# Patient Record
Sex: Female | Born: 1995
Health system: Southern US, Community
[De-identification: ages and names within clinical notes are randomized; demographics above are authoritative.]

## PROBLEM LIST (undated history)

## (undated) DIAGNOSIS — F4310 Post-traumatic stress disorder, unspecified: Secondary | ICD-10-CM

## (undated) DIAGNOSIS — R55 Syncope and collapse: Secondary | ICD-10-CM

## (undated) DIAGNOSIS — N83209 Unspecified ovarian cyst, unspecified side: Secondary | ICD-10-CM

## (undated) HISTORY — PX: TONSILLECTOMY: SUR1361

## (undated) HISTORY — DX: Syncope and collapse: R55

## (undated) HISTORY — PX: OOPHORECTOMY: SHX86

---
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
## 2016-03-21 ENCOUNTER — Emergency Department (HOSPITAL_COMMUNITY)
Admission: EM | Admit: 2016-03-21 | Discharge: 2016-03-21 | Disposition: A | Discharge status: Home / Self Care | Payer: BLUE CROSS/BLUE SHIELD | Attending: Emergency Medicine | Admitting: Emergency Medicine

## 2016-03-21 ENCOUNTER — Encounter (HOSPITAL_COMMUNITY): Payer: Self-pay | Admitting: Emergency Medicine

## 2016-03-21 DIAGNOSIS — R112 Nausea with vomiting, unspecified: Secondary | ICD-10-CM

## 2016-03-21 DIAGNOSIS — R1084 Generalized abdominal pain: Secondary | ICD-10-CM

## 2016-03-21 HISTORY — DX: Unspecified ovarian cyst, unspecified side: N83.209

## 2016-03-21 HISTORY — DX: Post-traumatic stress disorder, unspecified: F43.10

## 2016-03-21 LAB — URINALYSIS, ROUTINE W REFLEX MICROSCOPIC
GLUCOSE, UA: NEGATIVE mg/dL
HGB URINE DIPSTICK: NEGATIVE
LEUKOCYTES UA: NEGATIVE
Nitrite: NEGATIVE
PROTEIN: NEGATIVE mg/dL
Specific Gravity, Urine: 1.039 — ABNORMAL HIGH (ref 1.005–1.030)
pH: 5.5 (ref 5.0–8.0)

## 2016-03-21 LAB — CBC
HCT: 41.2 % (ref 36.0–46.0)
Hemoglobin: 13.7 g/dL (ref 12.0–15.0)
MCH: 30.7 pg (ref 26.0–34.0)
MCHC: 33.3 g/dL (ref 30.0–36.0)
MCV: 92.4 fL (ref 78.0–100.0)
PLATELETS: 213 10*3/uL (ref 150–400)
RBC: 4.46 MIL/uL (ref 3.87–5.11)
RDW: 13 % (ref 11.5–15.5)
WBC: 5 10*3/uL (ref 4.0–10.5)

## 2016-03-21 LAB — COMPREHENSIVE METABOLIC PANEL
ALK PHOS: 48 U/L (ref 38–126)
ALT: 15 U/L (ref 14–54)
AST: 17 U/L (ref 15–41)
Albumin: 4.5 g/dL (ref 3.5–5.0)
Anion gap: 6 (ref 5–15)
BILIRUBIN TOTAL: 1.2 mg/dL (ref 0.3–1.2)
BUN: 15 mg/dL (ref 6–20)
CALCIUM: 8.7 mg/dL — AB (ref 8.9–10.3)
CHLORIDE: 107 mmol/L (ref 101–111)
CO2: 25 mmol/L (ref 22–32)
CREATININE: 0.75 mg/dL (ref 0.44–1.00)
GFR calc Af Amer: >60 mL/min (ref >60)
Glucose, Bld: 88 mg/dL (ref 65–99)
Potassium: 3.4 mmol/L — ABNORMAL LOW (ref 3.5–5.1)
Sodium: 138 mmol/L (ref 135–145)
TOTAL PROTEIN: 7.4 g/dL (ref 6.5–8.1)

## 2016-03-21 LAB — I-STAT BETA HCG BLOOD, ED (MC, WL, AP ONLY): I-stat hCG, quantitative: <5 m[IU]/mL (ref <5)

## 2016-03-21 LAB — LIPASE, BLOOD: Lipase: 18 U/L (ref 11–51)

## 2016-03-21 MED ORDER — FAMOTIDINE 20 MG PO TABS: 20.0000 mg | ORAL_TABLET | Freq: Two times a day (BID) | ORAL | 0 refills | Status: DC

## 2016-03-21 MED ORDER — ONDANSETRON HCL 4 MG/2ML IJ SOLN
4.0000 mg | Freq: Once | INTRAMUSCULAR | Status: AC
  Administered 2016-03-21: 4 mg via INTRAVENOUS
  Filled 2016-03-21: qty 2

## 2016-03-21 MED ORDER — ONDANSETRON 4 MG PO TBDP: 4.0000 mg | ORAL_TABLET | Freq: Three times a day (TID) | ORAL | 0 refills | Status: DC | PRN

## 2016-03-21 MED ORDER — DICYCLOMINE HCL 10 MG PO CAPS
10.0000 mg | ORAL_CAPSULE | Freq: Once | ORAL | Status: AC
  Administered 2016-03-21: 10 mg via ORAL
  Filled 2016-03-21: qty 1

## 2016-03-21 MED ORDER — KETOROLAC TROMETHAMINE 30 MG/ML IJ SOLN
15.0000 mg | Freq: Once | INTRAMUSCULAR | Status: AC
  Administered 2016-03-21: 15 mg via INTRAVENOUS
  Filled 2016-03-21: qty 1

## 2016-03-21 MED ORDER — SODIUM CHLORIDE 0.9 % IV BOLUS (SEPSIS)
1000.0000 mL | Freq: Once | INTRAVENOUS | Status: AC
  Administered 2016-03-21: 1000 mL via INTRAVENOUS

## 2016-03-21 MED ORDER — ONDANSETRON 4 MG PO TBDP
4.0000 mg | ORAL_TABLET | Freq: Once | ORAL | Status: AC | PRN
  Administered 2016-03-21: 4 mg via ORAL
  Filled 2016-03-21: qty 1

## 2016-03-21 MED ORDER — DICYCLOMINE HCL 10 MG PO CAPS
10.0000 mg | ORAL_CAPSULE | Freq: Once | ORAL | Status: DC
  Filled 2016-03-21: qty 1

## 2016-03-21 MED ORDER — DICYCLOMINE HCL 20 MG PO TABS: 20.0000 mg | ORAL_TABLET | Freq: Two times a day (BID) | ORAL | 0 refills | Status: DC

## 2016-03-21 NOTE — Discharge Instructions (Signed)
Please read and follow all provided instructions.  Your diagnoses today include:  1. Non-intractable vomiting with nausea, unspecified vomiting type   2. Generalized abdominal pain     Tests performed today include:  Blood counts and electrolytes  Blood tests to check liver and kidney function  Blood tests to check pancreas function  Blood test for pregnancy - negative  Urine test to look for infection - urine shows mild dehydration  Vital signs. See below for your results today.   Medications prescribed:   Bentyl - medication for intestinal cramps and spasms   Zofran (ondansetron) - for nausea and vomiting   Pepcid (famotidine) - antihistamine  You can find this medication over-the-counter.   DO NOT exceed:   20mg  Pepcid every 12 hours  Take any prescribed medications only as directed.  Home care instructions:   Follow any educational materials contained in this packet.  Follow-up instructions: Please follow-up with your primary care provider in the next 2 days for further evaluation of your symptoms.    Return instructions:  SEEK IMMEDIATE MEDICAL ATTENTION IF:  The pain does not go away or becomes severe   A temperature above 101F develops   Repeated vomiting occurs (multiple episodes)   The pain becomes localized to portions of the abdomen. The right side could possibly be appendicitis. In an adult, the left lower portion of the abdomen could be colitis or diverticulitis.   Blood is being passed in stools or vomit (bright red or black tarry stools)   You develop chest pain, difficulty breathing, dizziness or fainting, or become confused, poorly responsive, or inconsolable (young children)  If you have any other emergent concerns regarding your health  Additional Information: Abdominal (belly) pain can be caused by many things. Your caregiver performed an examination and possibly ordered blood/urine tests and imaging (CT scan, x-rays, ultrasound).  Many cases can be observed and treated at home after initial evaluation in the emergency department. Even though you are being discharged home, abdominal pain can be unpredictable. Therefore, you need a repeated exam if your pain does not resolve, returns, or worsens. Most patients with abdominal pain don't have to be admitted to the hospital or have surgery, but serious problems like appendicitis and gallbladder attacks can start out as nonspecific pain. Many abdominal conditions cannot be diagnosed in one visit, so follow-up evaluations are very important.  Your vital signs today were: BP 110/71 (BP Location: Right Arm)    Pulse 63    Temp 98.2 F (36.8 C) (Oral)    Resp 18    Ht 5\' 8"  (1.727 m)    Wt 78 kg    LMP 03/19/2016    SpO2 99%    BMI 26.15 kg/m  If your blood pressure (bp) was elevated above 135/85 this visit, please have this repeated by your doctor within one month. --------------

## 2016-03-21 NOTE — ED Triage Notes (Signed)
[facility], c/o generalized abdominal pain and emesis since last night. Denies fevers and diarrhea.

## 2016-03-21 NOTE — ED Notes (Signed)
Discharge instructions, follow up care, and rx x3 reviewed with patient. Patient verbalized understanding. 

## 2016-03-21 NOTE — ED Provider Notes (Signed)
[facility]-EMERGENCY DEPT Provider Note   CSN: [id number] Arrival date & time: 03/21/16  1110     History   Chief Complaint Chief Complaint  Patient presents with  . Abdominal Pain  . Emesis    HPI Kelly Morton is a 20 y.o. female.  [facility] complaint of abdominal pain starting yesterday. Pain is generalized. It has been associated with nausea and multiple episodes of vomiting. Vomiting is nonbloody, nonbilious. No diarrhea. No fevers, chest pain, shortness of breath, urinary symptoms, or skin rashes. Patient has a history of ovarian cyst surgery. No other abdominal surgeries. States that she has not been able to keep down solids or liquids. The onset of this condition was acute. The course is constant. Aggravating factors: none. Alleviating factors: none.        Past Medical History:  Diagnosis Date  . Ovarian cyst   . PTSD (post-traumatic stress disorder)     There are no active problems to display for this patient.   Past Surgical History:  Procedure Laterality Date  . OOPHORECTOMY    . TONSILLECTOMY      OB History    No data available       Home Medications    Prior to Admission medications   Not on File    Family History History reviewed. No pertinent family history.  Social History Social History  Substance Use Topics  . Smoking status: Never Smoker  . Smokeless tobacco: Never Used  . Alcohol use No     Allergies   Bactrim [sulfamethoxazole-trimethoprim] and Sulfur   Review of Systems Review of Systems  Constitutional: Negative for fever.  HENT: Negative for rhinorrhea and sore throat.   Eyes: Negative for redness.  Respiratory: Negative for cough.   Cardiovascular: Negative for chest pain.  Gastrointestinal: Positive for abdominal pain, nausea and vomiting. Negative for blood in stool and diarrhea.  Genitourinary: Negative for dysuria, frequency and hematuria.  Musculoskeletal: Negative for  myalgias.  Skin: Negative for rash.  Neurological: Negative for headaches.     Physical Exam Updated Vital Signs BP 138/88 (BP Location: Right Arm)   Pulse 84   Temp 98.2 F (36.8 C) (Oral)   Resp 18   Ht 5\' 8"  (1.727 m)   Wt 78 kg   LMP 03/19/2016   SpO2 98%   BMI 26.15 kg/m   Physical Exam  Constitutional: She appears well-developed and well-nourished.  HENT:  Head: Normocephalic and atraumatic.  Mouth/Throat: Oropharynx is clear and moist.  Eyes: Conjunctivae are normal. Right eye exhibits no discharge. Left eye exhibits no discharge.  Neck: Normal range of motion. Neck supple.  Cardiovascular: Normal rate, regular rhythm and normal heart sounds.  Exam reveals no friction rub.   No murmur heard. Pulmonary/Chest: Effort normal and breath sounds normal. No respiratory distress. She has no wheezes. She has no rales.  Abdominal: Soft. She exhibits no distension and no mass. There is tenderness (moderate, generalized). There is no guarding.  Neurological: She is alert.  Skin: Skin is warm and dry.  Psychiatric: She has a normal mood and affect.  Nursing note and vitals reviewed.    ED Treatments / Results  Labs (all labs ordered are listed, but only abnormal results are displayed) Labs Reviewed  COMPREHENSIVE METABOLIC PANEL - Abnormal; Notable for the following:       Result Value   Potassium 3.4 (*)    Calcium 8.7 (*)    All other components within normal limits  URINALYSIS, ROUTINE W REFLEX MICROSCOPIC (NOT [facility]) - Abnormal; Notable for the following:    Color, Urine AMBER (*)    Specific Gravity, Urine 1.039 (*)    Bilirubin Urine SMALL (*)    Ketones, ur >80 (*)    All other components within normal limits  LIPASE, BLOOD  CBC  I-STAT BETA HCG BLOOD, ED ([facility], [facility], [facility] ONLY)    Procedures Procedures (including critical care time)  Medications Ordered in ED Medications  ondansetron (ZOFRAN-ODT) disintegrating tablet 4 mg (4 mg Oral Given 03/21/16 1126)    sodium chloride 0.9 % bolus 1,000 mL (0 mLs Intravenous Stopped 03/21/16 1647)  ondansetron (ZOFRAN) injection 4 mg (4 mg Intravenous Given 03/21/16 1439)  ketorolac (TORADOL) 30 MG/ML injection 15 mg (15 mg Intravenous Given 03/21/16 1452)  sodium chloride 0.9 % bolus 1,000 mL (0 mLs Intravenous Stopped 03/21/16 1647)  dicyclomine (BENTYL) capsule 10 mg (10 mg Oral Given 03/21/16 1701)  ondansetron (ZOFRAN) injection 4 mg (4 mg Intravenous Given 03/21/16 1701)     Initial Impression / Assessment and Plan / ED Course  I have reviewed the triage vital signs and the nursing notes.  Pertinent labs & imaging results that were available during my care of the patient were reviewed by me and considered in my medical decision making (see chart for details).  Clinical Course    Patient seen and examined. Work-up initiated. Medications ordered.   Vital signs reviewed and are as follows: BP 138/88 (BP Location: Right Arm)   Pulse 84   Temp 98.2 F (36.8 C) (Oral)   Resp 18   Ht 5\' 8"  (1.727 m)   Wt 78 kg   LMP 03/19/2016   SpO2 98%   BMI 26.15 kg/m   4:56 PM Pt rechecked. Overall better, nausea and pain are coming back a little bit. Additional IV zofran ordered, pt will try bentyl and oral fluid challenge. Anticipate d/c to home if doing well.   6:09 PM Family at bedside. Pt feels better. Counseled on clear liquids for the next 24 hours. Counseled on brat diet. [place].   The patient was urged to return to the Emergency Department immediately with worsening of current symptoms, worsening abdominal pain, persistent vomiting, blood noted in stools, fever, or any other concerns. The patient verbalized understanding.    Final Clinical Impressions(s) / ED Diagnoses   Final diagnoses:  Non-intractable vomiting with nausea, unspecified vomiting type  Generalized abdominal pain   Patient with nausea and vomiting, abdominal pain that is generalized.  Vitals are stable, no fever. UA suggests dehydration, patient hydrated 2 liters NS, tolerating PO's. Lungs are clear. No focal abdominal pain. Low concern for appendicitis, cholecystitis, pancreatitis, ruptured viscus, UTI, kidney stone, aortic dissection, aortic aneurysm or other emergent abdominal etiology. Supportive therapy indicated with return if symptoms worsen. Patient counseled.   New Prescriptions New Prescriptions   DICYCLOMINE (BENTYL) 20 MG TABLET    Take 1 tablet (20 mg total) by mouth 2 (two) times daily.   FAMOTIDINE (PEPCID) 20 MG TABLET    Take 1 tablet (20 mg total) by mouth 2 (two) times daily.   ONDANSETRON (ZOFRAN ODT) 4 MG DISINTEGRATING TABLET    Take 1 tablet (4 mg total) by mouth every 8 (eight) hours as needed for nausea or vomiting.     Renne CriglerJoshua Berklee Battey, PA-C 03/21/16 1815    Benjiman CoreNathan Pickering, MD 03/21/16 [phone]

## 2016-03-21 NOTE — ED Notes (Signed)
Patient given water for PO challenge per PA request.

## 2018-03-05 ENCOUNTER — Emergency Department (HOSPITAL_COMMUNITY)
Admission: EM | Admit: 2018-03-05 | Discharge: 2018-03-05 | Disposition: A | Discharge status: Home / Self Care | Payer: BLUE CROSS/BLUE SHIELD | Attending: Emergency Medicine | Admitting: Emergency Medicine

## 2018-03-05 ENCOUNTER — Other Ambulatory Visit: Payer: Self-pay

## 2018-03-05 ENCOUNTER — Emergency Department (HOSPITAL_COMMUNITY): Payer: BLUE CROSS/BLUE SHIELD

## 2018-03-05 ENCOUNTER — Encounter (HOSPITAL_COMMUNITY): Payer: Self-pay | Admitting: Emergency Medicine

## 2018-03-05 DIAGNOSIS — R102 Pelvic and perineal pain: Secondary | ICD-10-CM

## 2018-03-05 DIAGNOSIS — Z79899 Other long term (current) drug therapy: Secondary | ICD-10-CM | POA: Insufficient documentation

## 2018-03-05 DIAGNOSIS — N83202 Unspecified ovarian cyst, left side: Secondary | ICD-10-CM | POA: Diagnosis not present

## 2018-03-05 DIAGNOSIS — R0602 Shortness of breath: Secondary | ICD-10-CM | POA: Insufficient documentation

## 2018-03-05 LAB — URINALYSIS, ROUTINE W REFLEX MICROSCOPIC
BILIRUBIN URINE: NEGATIVE
Bacteria, UA: NONE SEEN
GLUCOSE, UA: NEGATIVE mg/dL
HGB URINE DIPSTICK: NEGATIVE
KETONES UR: NEGATIVE mg/dL
LEUKOCYTES UA: NEGATIVE
Nitrite: NEGATIVE
PH: 5 (ref 5.0–8.0)
PROTEIN: 30 mg/dL — AB
Specific Gravity, Urine: 1.039 — ABNORMAL HIGH (ref 1.005–1.030)

## 2018-03-05 LAB — CBC WITH DIFFERENTIAL/PLATELET
Abs Immature Granulocytes: 0.02 10*3/uL (ref 0.00–0.07)
Basophils Absolute: 0 10*3/uL (ref 0.0–0.1)
Basophils Relative: 0 %
Eosinophils Absolute: 0.1 10*3/uL (ref 0.0–0.5)
Eosinophils Relative: 1 %
HCT: 39.3 % (ref 36.0–46.0)
Hemoglobin: 12.4 g/dL (ref 12.0–15.0)
Immature Granulocytes: 0 %
Lymphocytes Relative: 27 %
Lymphs Abs: 2.1 10*3/uL (ref 0.7–4.0)
MCH: 29.5 pg (ref 26.0–34.0)
MCHC: 31.6 g/dL (ref 30.0–36.0)
MCV: 93.6 fL (ref 80.0–100.0)
Monocytes Absolute: 0.6 10*3/uL (ref 0.1–1.0)
Monocytes Relative: 8 %
Neutro Abs: 5 10*3/uL (ref 1.7–7.7)
Neutrophils Relative %: 64 %
Platelets: 273 10*3/uL (ref 150–400)
RBC: 4.2 MIL/uL (ref 3.87–5.11)
RDW: 12.5 % (ref 11.5–15.5)
WBC: 7.9 10*3/uL (ref 4.0–10.5)
nRBC: 0 % (ref 0.0–0.2)

## 2018-03-05 LAB — BASIC METABOLIC PANEL
Anion gap: 7 (ref 5–15)
BUN: 13 mg/dL (ref 6–20)
CO2: 24 mmol/L (ref 22–32)
Calcium: 8.8 mg/dL — ABNORMAL LOW (ref 8.9–10.3)
Chloride: 107 mmol/L (ref 98–111)
Creatinine, Ser: 0.8 mg/dL (ref 0.44–1.00)
GFR calc Af Amer: >60 mL/min (ref >60)
GFR calc non Af Amer: >60 mL/min (ref >60)
Glucose, Bld: 91 mg/dL (ref 70–99)
Potassium: 3.9 mmol/L (ref 3.5–5.1)
Sodium: 138 mmol/L (ref 135–145)

## 2018-03-05 LAB — I-STAT BETA HCG BLOOD, ED (MC, WL, AP ONLY): I-stat hCG, quantitative: <5 m[IU]/mL (ref <5)

## 2018-03-05 MED ORDER — IBUPROFEN 800 MG PO TABS: 800.0000 mg | ORAL_TABLET | Freq: Three times a day (TID) | ORAL | 0 refills | Status: AC

## 2018-03-05 NOTE — ED Notes (Signed)
Patient transported to [facility] 

## 2018-03-05 NOTE — ED Provider Notes (Signed)
[facility] EMERGENCY DEPARTMENT Provider Note   CSN: [id number] Arrival date & time: 03/05/18  1829     History   Chief Complaint Chief Complaint  Patient presents with  . Vaginal Bleeding    HPI Kelly Morton is a 22 y.o. female presenting with left sided pelvic pain onset yesterday. Patient describes pain as constant pressure and states it is intermittently a sharp pain. Patient reports Tylenol and Advil has provided some pain relief. Patient reports pain is worse with standing up straight. Patient states pain makes her short of breath and nauseous. Patient also reports is on her menstrual period and has been having more vaginal bleeding than usual. Patient denies vaginal discharge, dysuria, frequency, lesions, or vomiting. Patient reports she had a Dermoid cyst in her right fallopian tube and had to have an oopherectomy due to an ovarian torsion. Patient also reports she has a cyst on her left ovary and it is being evaluated by ultrasound yearly with last ultrasound 12/2016 by Dr. Anastasio Auerbach.  Patient also reports she has a Nexplanon for contraception.   HPI  Past Medical History:  Diagnosis Date  . Ovarian cyst   . PTSD (post-traumatic stress disorder)     There are no active problems to display for this patient.   Past Surgical History:  Procedure Laterality Date  . OOPHORECTOMY    . TONSILLECTOMY       OB History   None      Home Medications    Prior to Admission medications   Medication Sig Start Date End Date Taking? Authorizing Provider  acetaminophen (TYLENOL) 325 MG tablet Take 650 mg by mouth every 6 (six) hours as needed for mild pain.   Yes [provider]  EPINEPHrine 0.3 mg/0.3 mL IJ SOAJ injection Inject 0.3 mg into the muscle once.   Yes [provider]  etonogestrel (NEXPLANON) 68 MG IMPL implant 1 each by Subdermal route once.   Yes [provider]  dicyclomine (BENTYL) 20 MG tablet Take 1 tablet  (20 mg total) by mouth 2 (two) times daily. Patient not taking: Reported on 03/05/2018 03/21/16   Renne Crigler, PA-C  famotidine (PEPCID) 20 MG tablet Take 1 tablet (20 mg total) by mouth 2 (two) times daily. Patient not taking: Reported on 03/05/2018 03/21/16   Renne Crigler, PA-C  ibuprofen (ADVIL,MOTRIN) 800 MG tablet Take 1 tablet (800 mg total) by mouth 3 (three) times daily for 7 days. 03/05/18 03/12/18  Carlyle Basques P, PA-C  ondansetron (ZOFRAN ODT) 4 MG disintegrating tablet Take 1 tablet (4 mg total) by mouth every 8 (eight) hours as needed for nausea or vomiting. Patient not taking: Reported on 03/05/2018 03/21/16   Renne Crigler, PA-C    Family History No family history on file.  Social History Social History   Tobacco Use  . Smoking status: Never Smoker  . Smokeless tobacco: Never Used  Substance Use Topics  . Alcohol use: No  . Drug use: Not on file     Allergies   Bactrim [sulfamethoxazole-trimethoprim] and Sulfur   Review of Systems Review of Systems  Constitutional: Negative for activity change, appetite change, chills, diaphoresis, fatigue, fever and unexpected weight change.  Respiratory: Negative for shortness of breath.   Cardiovascular: Negative for palpitations.  Gastrointestinal: Positive for abdominal pain and nausea. Negative for constipation and vomiting.  Genitourinary: Positive for menstrual problem, pelvic pain and vaginal bleeding. Negative for decreased urine volume, difficulty urinating, dyspareunia, dysuria, flank pain, genital sores, urgency, vaginal  discharge and vaginal pain.  Musculoskeletal: Negative for back pain.  Skin: Negative for color change.  Allergic/Immunologic: Negative for immunocompromised state.  Neurological: Negative for dizziness, weakness and light-headedness.  Hematological: Negative for adenopathy.     Physical Exam Updated Vital Signs BP 114/66   Pulse 87   Temp 98.2 F (36.8 C) (Oral)   Resp 16   Ht 5'  8" (1.727 m)   Wt 97.5 kg   LMP 02/27/2018   SpO2 100%   BMI 32.69 kg/m   Physical Exam  Constitutional: She appears well-developed and well-nourished. No distress.  HENT:  Head: Normocephalic and atraumatic.  Cardiovascular: Normal rate, regular rhythm and normal heart sounds.  Pulmonary/Chest: Effort normal and breath sounds normal. No respiratory distress.  Abdominal: Soft. She exhibits no distension and no mass. There is tenderness (LLQ tenderness noted. ). There is no rebound and no guarding. No hernia. Hernia confirmed negative in the right inguinal area and confirmed negative in the left inguinal area.  Genitourinary: Vagina normal and uterus normal. Pelvic exam was performed with patient supine. No labial fusion. There is no rash, tenderness, lesion or injury on the right labia. There is no rash, tenderness, lesion or injury on the left labia. Uterus is not tender. Cervix exhibits no motion tenderness and no discharge. Right adnexum displays no mass and no tenderness. Left adnexum displays no mass and no tenderness. No erythema or tenderness in the vagina. No foreign body in the vagina. No signs of injury around the vagina. No vaginal discharge found.  Genitourinary Comments: Minimal blood noted on exam. Chaperone present throughout entire exam.    Lymphadenopathy: No inguinal adenopathy noted on the right or left side.  Neurological: She is alert.  Skin: Skin is warm. She is not diaphoretic.  Psychiatric: She has a normal mood and affect.  Nursing note and vitals reviewed.    ED Treatments / Results  Labs (all labs ordered are listed, but only abnormal results are displayed) Labs Reviewed  URINALYSIS, ROUTINE W REFLEX MICROSCOPIC - Abnormal; Notable for the following components:      Result Value   APPearance HAZY (*)    Specific Gravity, Urine 1.039 (*)    Protein, ur 30 (*)    All other components within normal limits  BASIC METABOLIC PANEL - Abnormal; Notable for the  following components:   Calcium 8.8 (*)    All other components within normal limits  CBC WITH DIFFERENTIAL/PLATELET  I-STAT BETA HCG BLOOD, ED ([facility], [facility], [facility] ONLY)    EKG None  Radiology US Transvaginal Non-ob  Result Date: 03/05/2018 CLINICAL DATA:  Initial evaluation for acute pelvic pain, vaginal bleeding. EXAM: TRANSABDOMINAL AND TRANSVAGINAL ULTRASOUND OF PELVIS DOPPLER ULTRASOUND OF OVARIES TECHNIQUE: Both transabdominal and transvaginal ultrasound examinations of the pelvis were performed. Transabdominal technique was performed for global imaging of the pelvis including uterus, ovaries, adnexal regions, and pelvic cul-de-sac. It was necessary to proceed with endovaginal exam following the transabdominal exam to visualize the uterus, endometrium, and ovaries. Color and duplex Doppler ultrasound was utilized to evaluate blood flow to the ovaries. COMPARISON:  None. FINDINGS: Uterus Measurements: 7.4 x 3.1 x 3.8 cm = volume: 46.8 mL. No fibroids or other mass visualized. Endometrium Thickness: 1.7 mm. No focal abnormality visualized. Small amount of simple anechoic fluid noted within the endocervical canal. Right ovary Not visualized, reportedly surgically absent. Left ovary Measurements: 4.1 x 3.5 x 3.7 cm = volume: 29.1 mL. 4 cm predominantly simple anechoic cyst is seen. Possible  single internal daughter cyst versus curvilinear septation. Small amount of associated vascularity. No internal solid components. Pulsed Doppler evaluation of the left ovary demonstrates normal low-resistance arterial and venous waveforms. Other findings No abnormal free fluid. IMPRESSION: 1. 4 cm mildly complex left ovarian cyst with single internal daughter cyst versus curvilinear septation. While this is likely benign, a short interval follow-up study in 6-12 weeks to ensure resolution is recommended. No evidence for associated torsion. 2. Nonvisualization of the right ovary, reportedly surgically absent. 3. Normal  sonographic appearance of the uterus and endometrium. Electronically Signed   By: Rise MuBenjamin  McClintock M.D.   On: 03/05/2018 21:57   [place] Pelvis Complete  Result Date: 03/05/2018 CLINICAL DATA:  Initial evaluation for acute pelvic pain, vaginal bleeding. EXAM: TRANSABDOMINAL AND TRANSVAGINAL ULTRASOUND OF PELVIS DOPPLER ULTRASOUND OF OVARIES TECHNIQUE: Both transabdominal and transvaginal ultrasound examinations of the pelvis were performed. Transabdominal technique was performed for global imaging of the pelvis including uterus, ovaries, adnexal regions, and pelvic cul-de-sac. It was necessary to proceed with endovaginal exam following the transabdominal exam to visualize the uterus, endometrium, and ovaries. Color and duplex Doppler ultrasound was utilized to evaluate blood flow to the ovaries. COMPARISON:  None. FINDINGS: Uterus Measurements: 7.4 x 3.1 x 3.8 cm = volume: 46.8 mL. No fibroids or other mass visualized. Endometrium Thickness: 1.7 mm. No focal abnormality visualized. Small amount of simple anechoic fluid noted within the endocervical canal. Right ovary Not visualized, reportedly surgically absent. Left ovary Measurements: 4.1 x 3.5 x 3.7 cm = volume: 29.1 mL. 4 cm predominantly simple anechoic cyst is seen. Possible single internal daughter cyst versus curvilinear septation. Small amount of associated vascularity. No internal solid components. Pulsed Doppler evaluation of the left ovary demonstrates normal low-resistance arterial and venous waveforms. Other findings No abnormal free fluid. IMPRESSION: 1. 4 cm mildly complex left ovarian cyst with single internal daughter cyst versus curvilinear septation. While this is likely benign, a short interval follow-up study in 6-12 weeks to ensure resolution is recommended. No evidence for associated torsion. 2. Nonvisualization of the right ovary, reportedly surgically absent. 3. Normal sonographic appearance of the uterus and endometrium. Electronically  Signed   By: Rise MuBenjamin  McClintock M.D.   On: 03/05/2018 21:57   [place] Art/ven Flow Abd Pelv Doppler  Result Date: 03/05/2018 CLINICAL DATA:  Initial evaluation for acute pelvic pain, vaginal bleeding. EXAM: TRANSABDOMINAL AND TRANSVAGINAL ULTRASOUND OF PELVIS DOPPLER ULTRASOUND OF OVARIES TECHNIQUE: Both transabdominal and transvaginal ultrasound examinations of the pelvis were performed. Transabdominal technique was performed for global imaging of the pelvis including uterus, ovaries, adnexal regions, and pelvic cul-de-sac. It was necessary to proceed with endovaginal exam following the transabdominal exam to visualize the uterus, endometrium, and ovaries. Color and duplex Doppler ultrasound was utilized to evaluate blood flow to the ovaries. COMPARISON:  None. FINDINGS: Uterus Measurements: 7.4 x 3.1 x 3.8 cm = volume: 46.8 mL. No fibroids or other mass visualized. Endometrium Thickness: 1.7 mm. No focal abnormality visualized. Small amount of simple anechoic fluid noted within the endocervical canal. Right ovary Not visualized, reportedly surgically absent. Left ovary Measurements: 4.1 x 3.5 x 3.7 cm = volume: 29.1 mL. 4 cm predominantly simple anechoic cyst is seen. Possible single internal daughter cyst versus curvilinear septation. Small amount of associated vascularity. No internal solid components. Pulsed Doppler evaluation of the left ovary demonstrates normal low-resistance arterial and venous waveforms. Other findings No abnormal free fluid. IMPRESSION: 1. 4 cm mildly complex left ovarian cyst with single internal daughter cyst versus  curvilinear septation. While this is likely benign, a short interval follow-up study in 6-12 weeks to ensure resolution is recommended. No evidence for associated torsion. 2. Nonvisualization of the right ovary, reportedly surgically absent. 3. Normal sonographic appearance of the uterus and endometrium. Electronically Signed   By: Rise Mu M.D.   On:  03/05/2018 21:57    Procedures Procedures (including critical care time)  Medications Ordered in ED Medications - No data to display   Initial Impression / Assessment and Plan / ED Course  I have reviewed the triage vital signs and the nursing notes.  Pertinent labs & imaging results that were available during my care of the patient were reviewed by me and considered in my medical decision making (see chart for details).  Clinical Course as of Mar 05 2245  Wed Mar 05, 2018  2223 Ultrasound reveals a 4cm complex cyst noted in left ovary. No evidence for associated torsion.      [AH]    Clinical Course User Index [AH] Leretha Dykes, PA-C    Patient presents with complaint of pelvic pain. Patient nontoxic appearing, in no apparent distress, vitals WNL, patient is stable.   Assessment/Plan: Patient is nontoxic, nonseptic appearing, in no apparent distress.  Labs, imaging and vitals reviewed. Transvaginal ultrasound reveals a 4cm mildly complex left ovarian cyst. No evidence of associated torsion noted on ultrasound. Will advise patient to follow up with OBGYN in 1 week or sooner regarding ovarian cyst and resolution of symptoms. Patient discharged home with symptomatic treatment and given strict instructions for follow-up with their primary care physician. I have also discussed reasons to return immediately to the ER.  Patient expresses understanding and agrees with plan.    Final Clinical Impressions(s) / ED Diagnoses   Final diagnoses:  Pelvic pain    ED Discharge Orders         Ordered    ibuprofen (ADVIL,MOTRIN) 800 MG tablet  3 times daily     03/05/18 2239           Leretha Dykes, [place] 03/05/18 2246    Eber Hong, MD 03/05/18 [phone]

## 2018-03-05 NOTE — Discharge Instructions (Addendum)
You have been seen today for pelvic pain. Please read and follow all provided instructions.   1. Medications: ibuprofen three times a day for pain (sent prescription to pharmacy), usual home medications 2. Treatment: rest, drink plenty of fluids 3. Follow Up: Please follow up with your primary doctor/OBGYN in 7 days for discussion of your diagnoses and further evaluation after today's visit; if you do not have a primary care doctor use the resource guide provided to find one; Please return to the ER for any new or worsening symptoms.   Take medications as prescribed. Return to the emergency room for worsening condition or new concerning symptoms. Follow up with your regular doctor. If you don't have a regular doctor use one of the numbers below to establish a primary care doctor.  Please obtain all of your results from medical records or have your doctors office obtain the results - share them with your doctor - you should be seen at your doctors office in the next 7 days. Call today to arrange your follow up. Take the medications as prescribed. Please review all of the medicines and only take them if you do not have an allergy to them. Please be aware that if you are taking birth control pills, taking other prescriptions, ESPECIALLY ANTIBIOTICS may make the birth control ineffective - if this is the case, either do not engage in sexual activity or use alternative methods of birth control such as condoms until you have finished the medicine and your family doctor says it is OK to restart them. If you are on a blood thinner such as COUMADIN, be aware that any other medicine that you take may cause the coumadin to either work too much, or not enough - you should have your coumadin level rechecked in next 7 days if this is the case.  ?  It is also a possibility that you have an allergic reaction to any of the medicines that you have been prescribed - Everybody reacts differently to medications and while MOST  people have no trouble with most medicines, you may have a reaction such as nausea, vomiting, rash, swelling, shortness of breath. If this is the case, please stop taking the medicine immediately and contact your physician.  ?  You should return to the ER if you develop severe or worsening symptoms.

## 2018-03-05 NOTE — ED Notes (Addendum)
Pt reports right fallopian tube, ovary and cyst was removed.

## 2018-03-05 NOTE — ED Provider Notes (Signed)
Medical screening examination/treatment/procedure(s) were conducted as a shared visit with non-physician practitioner(s) and myself.  I personally evaluated the patient during the encounter.  Clinical Impression:   Final diagnoses:  Pelvic pain   Pelvic pain and bleeding since last night.  18 hours of ongoing pain - non stop - fluctuates in intensity Prior abd surgery to remove R fallopian and ovary due to torsion. Started last night.  Known cyst on L ovary She is on menstrual cycle day 6.  Concerned about increased bleeding. 2-3 tampons today.  On exam - has mild ttp in the LLQ APP to perform pelvic exam.  W/u for abd pain with bleedign - r/o ectopic and torsion Otherwise f/u with GYN.  Vitals:   03/05/18 2148 03/05/18 2200 03/05/18 2215 03/05/18 2230  BP: 128/69 128/80 119/71 114/66  Pulse: 81 72 80 87  Resp:      Temp:      TempSrc:      SpO2: 99% 100% 100% 100%  Weight:      Height:          Eber HongMiller, Pascha Fogal, MD 03/05/18 2307

## 2018-03-05 NOTE — ED Notes (Signed)
Discharge instructions discussed with Pt. Pt verbalized understanding. Pt stable and ambulatory.    

## 2018-03-05 NOTE — ED Triage Notes (Signed)
Pt c/o abnormal vaginal bleeding. Reports that normally, her menstrual cycle is very light on the 6th day, this morning she had a moderate amount of bright red blood. Some pelvic pain, hx dermoid cyst.

## 2018-09-03 ENCOUNTER — Other Ambulatory Visit: Payer: Self-pay | Admitting: Urgent Care

## 2018-09-03 ENCOUNTER — Other Ambulatory Visit: Payer: Self-pay

## 2018-09-03 ENCOUNTER — Ambulatory Visit
Admission: RE | Admit: 2018-09-03 | Discharge: 2018-09-03 | Disposition: A | Discharge status: Home / Self Care | Payer: 59 | Source: Ambulatory Visit | Attending: Urgent Care | Admitting: Urgent Care

## 2018-09-03 DIAGNOSIS — M545 Low back pain, unspecified: Secondary | ICD-10-CM

## 2019-06-12 ENCOUNTER — Encounter (HOSPITAL_COMMUNITY): Payer: Self-pay | Admitting: *Deleted

## 2019-06-12 ENCOUNTER — Other Ambulatory Visit: Payer: Self-pay

## 2019-06-12 ENCOUNTER — Emergency Department (HOSPITAL_COMMUNITY)
Admission: EM | Admit: 2019-06-12 | Discharge: 2019-06-12 | Disposition: A | Discharge status: Home / Self Care | Payer: 59 | Attending: Emergency Medicine | Admitting: Emergency Medicine

## 2019-06-12 DIAGNOSIS — Z79899 Other long term (current) drug therapy: Secondary | ICD-10-CM | POA: Insufficient documentation

## 2019-06-12 DIAGNOSIS — B9789 Other viral agents as the cause of diseases classified elsewhere: Secondary | ICD-10-CM

## 2019-06-12 DIAGNOSIS — H9202 Otalgia, left ear: Secondary | ICD-10-CM

## 2019-06-12 DIAGNOSIS — Z793 Long term (current) use of hormonal contraceptives: Secondary | ICD-10-CM | POA: Diagnosis not present

## 2019-06-12 DIAGNOSIS — J019 Acute sinusitis, unspecified: Secondary | ICD-10-CM | POA: Insufficient documentation

## 2019-06-12 MED ORDER — DEXAMETHASONE 10 MG/ML FOR PEDIATRIC ORAL USE
4.0000 mg | Freq: Once | INTRAMUSCULAR | Status: AC
  Administered 2019-06-12: 4 mg via ORAL
  Filled 2019-06-12: qty 0.4

## 2019-06-12 MED ORDER — DEXAMETHASONE 10 MG/ML FOR PEDIATRIC ORAL USE
4.0000 mg | Freq: Once | INTRAMUSCULAR | Status: DC
  Filled 2019-06-12: qty 0.4

## 2019-06-12 MED ORDER — DEXAMETHASONE 1 MG/ML PO CONC
4.0000 mg | Freq: Once | ORAL | Status: DC
  Filled 2019-06-12: qty 4

## 2019-06-12 NOTE — ED Notes (Signed)
I triaged this pt  No  Other information than I documented   No distress

## 2019-06-12 NOTE — ED Notes (Signed)
The pt has been waiting on med from [facility] for 45 minutes  Second request for med

## 2019-06-12 NOTE — Discharge Instructions (Addendum)
Please use fluticasone 3 times daily please use 2 sprays in each nostril as we discussed and lay on the opposite side for 5 minutes. Please use Nettie Potts.  You can also use throat lozenges for sore throat.  I recommend warm tea and hot soup and to stay very hydrated.  I also recommend decongestant such as over-the-counter Sudafed or you can use Benadryl 50 mg.  Sudafed will ampule up as it is a stimulant so I recommend using this in the morning if you are congested and Benadryl more in the evenings.  You may also use Tylenol and ibuprofen.  I have written the maximum dosage as below. Please use Tylenol or ibuprofen for pain.  You may use 600 mg ibuprofen every 6 hours or 1000 mg of Tylenol every 6 hours.  You may choose to alternate between the 2.  This would be most effective.  Not to exceed 4 g of Tylenol within 24 hours.  Not to exceed 3200 mg ibuprofen 24 hours.

## 2019-06-12 NOTE — ED Triage Notes (Signed)
The pt has had lt ear pinin for 2 days  Now shw has throat and jaw pain  lmp feb 14th

## 2019-06-12 NOTE — ED Provider Notes (Signed)
[facility] EMERGENCY DEPARTMENT Provider Note   CSN: [id number] Arrival date & time: 06/12/19  1810     History Chief Complaint  Patient presents with  . Otalgia    Kelly Morton is a 24 y.o. female with no significant past medical history  HPI Patient is 24 year old female presented today with left ear pain for 2 days.  She states that the pain is achy, feels like pressure, nonradiating, moderate and constant.  She states that 2 days ago she first noticed that she was having some left ear itchiness.  She states that the next day she was having some pressure in her left ear and had a teledoc appointment where she was prescribed Flonase and ibuprofen and told that she had eustachian tube dysfunction.  Patient states that she has used Flonase twice since that appointment and states that this morning she noticed a small amount of discharge from her ear.  She was concerned for an ear infection and presented to the ED for evaluation.  Patient denies any changes of hearing, any fevers, chills.  She states she is also been rather congested for the last several days and states last night she felt that she was having trouble breathing through her nose.  Patient denies any nausea, vomiting, headache, dizziness.  Denies any cough or body aches.     Past Medical History:  Diagnosis Date  . Ovarian cyst   . PTSD (post-traumatic stress disorder)     There are no problems to display for this patient.   Past Surgical History:  Procedure Laterality Date  . OOPHORECTOMY    . TONSILLECTOMY       OB History   No obstetric history on file.     No family history on file.  Social History   Tobacco Use  . Smoking status: Never Smoker  . Smokeless tobacco: Never Used  Substance Use Topics  . Alcohol use: No  . Drug use: Not on file    Home Medications Prior to Admission medications   Medication Sig Start Date End Date Taking? Authorizing Provider    acetaminophen (TYLENOL) 325 MG tablet Take 650 mg by mouth every 6 (six) hours as needed for mild pain.    [provider]  dicyclomine (BENTYL) 20 MG tablet Take 1 tablet (20 mg total) by mouth 2 (two) times daily. Patient not taking: Reported on 03/05/2018 03/21/16   Renne Crigler, PA-C  EPINEPHrine 0.3 mg/0.3 mL IJ SOAJ injection Inject 0.3 mg into the muscle once.    [provider]  etonogestrel (NEXPLANON) 68 MG IMPL implant 1 each by Subdermal route once.    [provider]  famotidine (PEPCID) 20 MG tablet Take 1 tablet (20 mg total) by mouth 2 (two) times daily. Patient not taking: Reported on 03/05/2018 03/21/16   Renne Crigler, PA-C  ondansetron (ZOFRAN ODT) 4 MG disintegrating tablet Take 1 tablet (4 mg total) by mouth every 8 (eight) hours as needed for nausea or vomiting. Patient not taking: Reported on 03/05/2018 03/21/16   Renne Crigler, PA-C    Allergies    Bactrim [sulfamethoxazole-trimethoprim] and Sulfur  Review of Systems   Review of Systems  Constitutional: Negative for chills and fever.  HENT: Positive for congestion, ear discharge and ear pain. Negative for hearing loss.   Eyes: Negative for pain.  Respiratory: Negative for cough and shortness of breath.   Cardiovascular: Negative for chest pain and leg swelling.  Gastrointestinal: Negative for abdominal pain and vomiting.  Genitourinary: Negative for dysuria.  Musculoskeletal: Negative for myalgias.  Skin: Negative for rash.  Neurological: Negative for dizziness and headaches.    Physical Exam Updated Vital Signs BP 118/77 (BP Location: Right Arm)   Pulse 70   Temp 98.7 F (37.1 C) (Oral)   Resp 14   Ht 5\' 8"  (1.727 m)   Wt 97.5 kg   LMP 06/07/2019   SpO2 100%   BMI 32.68 kg/m   Physical Exam Vitals and nursing note reviewed.  Constitutional:      General: She is not in acute distress.    Appearance: Normal appearance. She is not ill-appearing.  HENT:     Head:  Normocephalic and atraumatic.     Comments: Mild tenderness to palpation of frontal and maxillary sinuses    Right Ear: Tympanic membrane, ear canal and external ear normal.     Left Ear: Tympanic membrane, ear canal and external ear normal.     Ears:     Comments: Bilateral TMs are clear with good cone of light and without any effusion or injection.  EAC is clear with no wax or purulent drainage.  No pain with palpation of external ear or tugging of pinna or tragus.      Mouth/Throat:     Mouth: Mucous membranes are moist.  Eyes:     General: No scleral icterus.       Right eye: No discharge.        Left eye: No discharge.     Conjunctiva/sclera: Conjunctivae normal.  Cardiovascular:     Rate and Rhythm: Normal rate.     Comments: Bilateral radial pulse 3+ symmetrically Pulmonary:     Effort: Pulmonary effort is normal.     Breath sounds: No stridor.  Skin:    Comments: No rashes, bruising, ecchymoses  Neurological:     Mental Status: She is alert and oriented to person, place, and time. Mental status is at baseline.  Psychiatric:        Mood and Affect: Mood normal.        Behavior: Behavior normal.     ED Results / Procedures / Treatments   Labs (all labs ordered are listed, but only abnormal results are displayed) Labs Reviewed - No data to display  EKG None  Radiology No results found.  Procedures Procedures (including critical care time)  Medications Ordered in ED Medications  dexamethasone (DECADRON) 1 MG/ML solution 4 mg (has no administration in time range)    ED Course  I have reviewed the triage vital signs and the nursing notes.  Pertinent labs & imaging results that were available during my care of the patient were reviewed by me and considered in my medical decision making (see chart for details).    MDM Rules/Calculators/A&P                      Patient presents today for left ear pain and congestion.  She is been prescribed fluticasone by PCP  on teledoc visit yesterday however has some ear discharge today and was concerned.  On my exam she has no abnormalities on otoscopic exam.  She does have some tenderness palpation of the frontal maxillary sinuses which is consistent with viral sinusitis.  Patient does not meet IDSA guidelines for anabiotic usage.  I gave her return precautions and recommended that if she spike a fever, have worsening symptoms or as symptoms that persist for more than 10 days or second sickening to follow-up  with PCP or ED for reevaluation.  Patient discharged with fluticasone which she has already been prescribed I recommended dosage and frequency of use.  Recommended Tylenol or Profen for pain.  Recommended Sudafed or Benadryl for her congestion.  She is understanding of plan well-appearing as time is vitals within normal limits.  Final Clinical Impression(s) / ED Diagnoses Final diagnoses:  Left ear pain  Acute viral sinusitis    Rx / DC Orders ED Discharge Orders    None       Gailen Shelter, [place] 06/12/19 1925    Virgina Norfolk, DO 06/12/19 2020

## 2019-10-02 ENCOUNTER — Ambulatory Visit (HOSPITAL_COMMUNITY)
Admission: EM | Admit: 2019-10-02 | Discharge: 2019-10-02 | Disposition: A | Discharge status: Home / Self Care | Payer: 59 | Attending: Family Medicine | Admitting: Family Medicine

## 2019-10-02 ENCOUNTER — Encounter (HOSPITAL_COMMUNITY): Payer: Self-pay

## 2019-10-02 ENCOUNTER — Other Ambulatory Visit: Payer: Self-pay

## 2019-10-02 DIAGNOSIS — B09 Unspecified viral infection characterized by skin and mucous membrane lesions: Secondary | ICD-10-CM | POA: Diagnosis not present

## 2019-10-02 MED ORDER — GABAPENTIN 300 MG PO CAPS
ORAL_CAPSULE | ORAL | 0 refills | Status: DC
Start: 2019-10-02 — End: 2019-12-16

## 2019-10-02 MED ORDER — VALACYCLOVIR HCL 1 G PO TABS: 1000.0000 mg | ORAL_TABLET | Freq: Three times a day (TID) | ORAL | 0 refills | Status: DC

## 2019-10-02 NOTE — ED Triage Notes (Signed)
PT has clusters of red raised bumps in areas from left hip to left kneex3 days. Pt states it itches, but mostly painful. Pt states she's having pain from left hip down to left ankle. Pt c/o 6/10 pain.

## 2019-10-02 NOTE — Discharge Instructions (Signed)
Take Valtrex 3 times a day May take 3 doses today Take gabapentin as needed for nerve pain This may cause drowsiness It is useful at bedtime Your culture report will be available on MyChart You will be called if your culture is positive

## 2019-10-02 NOTE — ED Provider Notes (Signed)
[facility]    CSN: [id number] Arrival date & time: 10/02/19  0805      History   Chief Complaint Chief Complaint  Patient presents with   Herpes Zoster    HPI Kelly Morton is a 24 y.o. female.   HPI  Patient is a rash on her leg.  It is painful.  It is been present for 2-1/2, 3 days. She has never had chickenpox.  She had the chickenpox vaccine as a child. She has never had herpes simplex, that she knows of.  She states that she does have a history of a couple of "cold sores".  She never thought that these might be herpes. She states that she has spoken to her partner.  She has never had herpes either. She is healthy.  Past Medical History:  Diagnosis Date   Ovarian cyst    PTSD (post-traumatic stress disorder)     There are no problems to display for this patient.   Past Surgical History:  Procedure Laterality Date   OOPHORECTOMY     TONSILLECTOMY      OB History   No obstetric history on file.      Home Medications    Prior to Admission medications   Medication Sig Start Date End Date Taking? Authorizing Provider  acetaminophen (TYLENOL) 325 MG tablet Take 650 mg by mouth every 6 (six) hours as needed for mild pain.    [provider]  EPINEPHrine 0.3 mg/0.3 mL IJ SOAJ injection Inject 0.3 mg into the muscle once.    [provider]  etonogestrel (NEXPLANON) 68 MG IMPL implant 1 each by Subdermal route once.    [provider]  gabapentin (NEURONTIN) 300 MG capsule may take up to 3 x a day for nerve pain.  Caution drowsiness. 10/02/19   Eustace Moore, MD  valACYclovir (VALTREX) 1000 MG tablet Take 1 tablet (1,000 mg total) by mouth 3 (three) times daily. 10/02/19   Eustace Moore, MD  dicyclomine (BENTYL) 20 MG tablet Take 1 tablet (20 mg total) by mouth 2 (two) times daily. Patient not taking: Reported on 03/05/2018 03/21/16 10/02/19  Renne Crigler, PA-C  famotidine (PEPCID) 20 MG tablet Take 1 tablet  (20 mg total) by mouth 2 (two) times daily. Patient not taking: Reported on 03/05/2018 03/21/16 10/02/19  Renne Crigler, PA-C    Family History Family History  Problem Relation Age of Onset   Hypertension Mother    Stroke Mother    Hypertension Father     Social History Social History   Tobacco Use   Smoking status: Never Smoker   Smokeless tobacco: Never Used  Substance Use Topics   Alcohol use: Yes    Alcohol/week: 6.0 standard drinks    Types: 6 Glasses of wine per week   Drug use: Never     Allergies   Bactrim [sulfamethoxazole-trimethoprim] and Sulfur   Review of Systems Review of Systems  Skin: Positive for rash.     Physical Exam Triage Vital Signs ED Triage Vitals  Enc Vitals Group     BP 10/02/19 0829 117/73     Pulse Rate 10/02/19 0829 73     Resp 10/02/19 0829 16     Temp 10/02/19 0829 98.2 F (36.8 C)     Temp Source 10/02/19 0829 Oral     SpO2 10/02/19 0829 100 %     Weight 10/02/19 0830 186 lb (84.4 kg)     Height 10/02/19 0830 5\' 8"  (1.727  m)     Head Circumference --      Peak Flow --      Pain Score 10/02/19 0829 6     Pain Loc --      Pain Edu? --      Excl. in [place]? --    No data found.  Updated Vital Signs BP 117/73    Pulse 73    Temp 98.2 F (36.8 C) (Oral)    Resp 16    Ht 5\' 8"  (1.727 m)    Wt 84.4 kg    SpO2 100%    BMI 28.28 kg/m     Physical Exam Constitutional:      General: She is not in acute distress.    Appearance: She is well-developed.  HENT:     Head: Normocephalic and atraumatic.  Eyes:     Conjunctiva/sclera: Conjunctivae normal.     Pupils: Pupils are equal, round, and reactive to light.  Cardiovascular:     Rate and Rhythm: Normal rate.  Pulmonary:     Effort: Pulmonary effort is normal. No respiratory distress.  Abdominal:     General: There is no distension.     Palpations: Abdomen is soft.  Musculoskeletal:        General: Normal range of motion.     Cervical back: Normal range of motion.    Skin:    General: Skin is warm and dry.       Neurological:     Mental Status: She is alert.  Psychiatric:        Mood and Affect: Mood normal.        Behavior: Behavior normal.      UC Treatments / Results  Labs (all labs ordered are listed, but only abnormal results are displayed) Labs Reviewed  HSV CULTURE AND TYPING    EKG   Radiology No results found.  Procedures Procedures (including critical care time)  Medications Ordered in UC Medications - No data to display  Initial Impression / Assessment and Plan / UC Course  I have reviewed the triage vital signs and the nursing notes.  Pertinent labs & imaging results that were available during my care of the patient were reviewed by me and considered in my medical decision making (see chart for details).     Discussed that it is unlikely that this is herpes zoster given the fact that she has had the chickenpox vaccines.  It is possible.  Discussed that this could be herpes simplex.  She may have had herpes simplex 1.  No known herpes simplex 2.  I think the best approach is to culture the active vesicles.  Treat with antivirals.  Follow-up as needed Final Clinical Impressions(s) / UC Diagnoses   Final diagnoses:  Viral rash     Discharge Instructions     Take Valtrex 3 times a day May take 3 doses today Take gabapentin as needed for nerve pain This may cause drowsiness It is useful at bedtime Your culture report will be available on MyChart You will be called if your culture is positive   ED Prescriptions    Medication Sig Dispense Auth. Provider   valACYclovir (VALTREX) 1000 MG tablet Take 1 tablet (1,000 mg total) by mouth 3 (three) times daily. 21 tablet Raylene Everts, MD   gabapentin (NEURONTIN) 300 MG capsule may take up to 3 x a day for nerve pain.  Caution drowsiness. 30 capsule Raylene Everts, MD     PDMP  not reviewed this encounter.   Eustace Moore, MD 10/02/19 [phone]

## 2019-10-05 LAB — HSV CULTURE AND TYPING

## 2019-12-08 ENCOUNTER — Other Ambulatory Visit: Payer: Self-pay

## 2019-12-08 ENCOUNTER — Other Ambulatory Visit: Payer: 59

## 2019-12-08 DIAGNOSIS — Z20822 Contact with and (suspected) exposure to covid-19: Secondary | ICD-10-CM

## 2019-12-09 LAB — SARS-COV-2, NAA 2 DAY TAT

## 2019-12-09 LAB — NOVEL CORONAVIRUS, NAA: SARS-CoV-2, NAA: NOT DETECTED

## 2019-12-14 ENCOUNTER — Other Ambulatory Visit: Payer: Self-pay

## 2019-12-14 ENCOUNTER — Emergency Department (HOSPITAL_COMMUNITY)
Admission: EM | Admit: 2019-12-14 | Discharge: 2019-12-14 | Disposition: A | Discharge status: Home / Self Care | Payer: 59 | Attending: Emergency Medicine | Admitting: Emergency Medicine

## 2019-12-14 ENCOUNTER — Encounter (HOSPITAL_COMMUNITY): Payer: Self-pay | Admitting: Emergency Medicine

## 2019-12-14 ENCOUNTER — Emergency Department (HOSPITAL_COMMUNITY): Payer: 59

## 2019-12-14 DIAGNOSIS — R55 Syncope and collapse: Secondary | ICD-10-CM | POA: Insufficient documentation

## 2019-12-14 DIAGNOSIS — Z20822 Contact with and (suspected) exposure to covid-19: Secondary | ICD-10-CM | POA: Diagnosis not present

## 2019-12-14 LAB — CBC
HCT: 40.1 % (ref 36.0–46.0)
Hemoglobin: 13.5 g/dL (ref 12.0–15.0)
MCH: 31.9 pg (ref 26.0–34.0)
MCHC: 33.7 g/dL (ref 30.0–36.0)
MCV: 94.8 fL (ref 80.0–100.0)
Platelets: 256 10*3/uL (ref 150–400)
RBC: 4.23 MIL/uL (ref 3.87–5.11)
RDW: 12.5 % (ref 11.5–15.5)
WBC: 5.1 10*3/uL (ref 4.0–10.5)
nRBC: 0 % (ref 0.0–0.2)

## 2019-12-14 LAB — BASIC METABOLIC PANEL
Anion gap: 8 (ref 5–15)
BUN: 11 mg/dL (ref 6–20)
CO2: 22 mmol/L (ref 22–32)
Calcium: 8.6 mg/dL — ABNORMAL LOW (ref 8.9–10.3)
Chloride: 107 mmol/L (ref 98–111)
Creatinine, Ser: 0.63 mg/dL (ref 0.44–1.00)
GFR calc Af Amer: >60 mL/min (ref >60)
GFR calc non Af Amer: >60 mL/min (ref >60)
Glucose, Bld: 102 mg/dL — ABNORMAL HIGH (ref 70–99)
Potassium: 3.9 mmol/L (ref 3.5–5.1)
Sodium: 137 mmol/L (ref 135–145)

## 2019-12-14 LAB — I-STAT BETA HCG BLOOD, ED (MC, WL, AP ONLY): I-stat hCG, quantitative: <5 m[IU]/mL (ref <5)

## 2019-12-14 LAB — URINALYSIS, ROUTINE W REFLEX MICROSCOPIC
Bilirubin Urine: NEGATIVE
Glucose, UA: NEGATIVE mg/dL
Hgb urine dipstick: NEGATIVE
Ketones, ur: NEGATIVE mg/dL
Leukocytes,Ua: NEGATIVE
Nitrite: NEGATIVE
Protein, ur: NEGATIVE mg/dL
Specific Gravity, Urine: 1.017 (ref 1.005–1.030)
pH: 6 (ref 5.0–8.0)

## 2019-12-14 LAB — CBG MONITORING, ED: Glucose-Capillary: 107 mg/dL — ABNORMAL HIGH (ref 70–99)

## 2019-12-14 MED ORDER — ONDANSETRON 4 MG PO TBDP
4.0000 mg | ORAL_TABLET | Freq: Once | ORAL | Status: AC | PRN
  Administered 2019-12-14: 4 mg via ORAL
  Filled 2019-12-14: qty 1

## 2019-12-14 MED ORDER — SODIUM CHLORIDE 0.9 % IV BOLUS
1000.0000 mL | Freq: Once | INTRAVENOUS | Status: AC
  Administered 2019-12-14: 1000 mL via INTRAVENOUS

## 2019-12-14 MED ORDER — SODIUM CHLORIDE 0.9 % IV BOLUS
500.0000 mL | Freq: Once | INTRAVENOUS | Status: AC
  Administered 2019-12-14: 500 mL via INTRAVENOUS

## 2019-12-14 NOTE — ED Provider Notes (Signed)
[facility]-EMERGENCY DEPT Provider Note   CSN: [id number] Arrival date & time: 12/14/19  0900     History Chief Complaint  Patient presents with   Loss of Consciousness    Kelly Morton is a 24 y.o. female past medical history of PTSD, migraine headache, presenting to the emergency department with complaint of syncope.  She states since yesterday she has had about 6 episodes of syncope.  They all have a prodrome of lightheadedness.  They mostly occur at rest.  She states one occurrence she had heart racing and addition to the lightheadedness.  She states 2 other occurrences she had pain behind her eyes which subsided.  She states this has not occurred before.  She states when she passes out she can still hear things that are occurring in the room, however she feels she is unable to respond.  She has had no decrease in p.o. intake, and drinks quite a bit of water daily.  Denies persistent headache or vision changes, chest pain, shortness of breath, abdominal pain, vomiting or diarrhea, urinary symptoms.  The history is provided by the patient.       Past Medical History:  Diagnosis Date   Ovarian cyst    PTSD (post-traumatic stress disorder)     There are no problems to display for this patient.   Past Surgical History:  Procedure Laterality Date   OOPHORECTOMY     TONSILLECTOMY       OB History   No obstetric history on file.     Family History  Problem Relation Age of Onset   Hypertension Mother    Stroke Mother    Hypertension Father     Social History   Tobacco Use   Smoking status: Never Smoker   Smokeless tobacco: Never Used  Substance Use Topics   Alcohol use: Yes    Alcohol/week: 6.0 standard drinks    Types: 6 Glasses of wine per week   Drug use: Never    Home Medications Prior to Admission medications   Medication Sig Start Date End Date Taking? Authorizing Provider  acetaminophen (TYLENOL) 325 MG tablet Take  650 mg by mouth every 6 (six) hours as needed for mild pain.   Yes [provider]  aspirin-acetaminophen-caffeine (EXCEDRIN MIGRAINE) [phone] MG tablet Take 1 tablet by mouth every 6 (six) hours as needed for headache or migraine.   Yes [provider]  etonogestrel (NEXPLANON) 68 MG IMPL implant 1 each by Subdermal route once.   Yes [provider]  ibuprofen (ADVIL) 600 MG tablet Take 600 mg by mouth every 6 (six) hours as needed for moderate pain or cramping.   Yes [provider]  gabapentin (NEURONTIN) 300 MG capsule may take up to 3 x a day for nerve pain.  Caution drowsiness. Patient not taking: Reported on 12/14/2019 10/02/19   Eustace Moore, MD  valACYclovir (VALTREX) 1000 MG tablet Take 1 tablet (1,000 mg total) by mouth 3 (three) times daily. Patient not taking: Reported on 12/14/2019 10/02/19   Eustace Moore, MD  dicyclomine (BENTYL) 20 MG tablet Take 1 tablet (20 mg total) by mouth 2 (two) times daily. Patient not taking: Reported on 03/05/2018 03/21/16 10/02/19  Renne Crigler, PA-C  famotidine (PEPCID) 20 MG tablet Take 1 tablet (20 mg total) by mouth 2 (two) times daily. Patient not taking: Reported on 03/05/2018 03/21/16 10/02/19  Renne Crigler, PA-C    Allergies    Bactrim [sulfamethoxazole-trimethoprim] and Sulfur  Review of  Systems   Review of Systems  Neurological: Positive for syncope, light-headedness and headaches.  All other systems reviewed and are negative.   Physical Exam Updated Vital Signs BP 118/84 (BP Location: Left Arm)    Pulse 76    Temp 98.5 F (36.9 C) (Oral)    Resp 18    Ht 5\' 8"  (1.727 m)    Wt 85 kg    SpO2 100%    BMI 28.49 kg/m   Physical Exam Vitals and nursing note reviewed.  Constitutional:      General: She is not in acute distress.    Appearance: She is well-developed. She is not ill-appearing.  HENT:     Head: Normocephalic and atraumatic.  Eyes:     Extraocular Movements: Extraocular  movements intact.     Conjunctiva/sclera: Conjunctivae normal.     Pupils: Pupils are equal, round, and reactive to light.  Cardiovascular:     Rate and Rhythm: Normal rate and regular rhythm.  Pulmonary:     Effort: Pulmonary effort is normal. No respiratory distress.     Breath sounds: Normal breath sounds.  Abdominal:     General: Bowel sounds are normal.     Palpations: Abdomen is soft.     Tenderness: There is no abdominal tenderness.  Skin:    General: Skin is warm.  Neurological:     Mental Status: She is alert.  Psychiatric:        Behavior: Behavior normal.     ED Results / Procedures / Treatments   Labs (all labs ordered are listed, but only abnormal results are displayed) Labs Reviewed  BASIC METABOLIC PANEL - Abnormal; Notable for the following components:      Result Value   Glucose, Bld 102 (*)    Calcium 8.6 (*)    All other components within normal limits  CBG MONITORING, ED - Abnormal; Notable for the following components:   Glucose-Capillary 107 (*)    All other components within normal limits  SARS CORONAVIRUS 2 BY RT PCR (HOSPITAL ORDER, PERFORMED IN [facility])  CBC  URINALYSIS, ROUTINE W REFLEX MICROSCOPIC  I-STAT BETA HCG BLOOD, ED ([facility], [facility], [facility] ONLY)    EKG EKG Interpretation  Date/Time:  Monday December 14 2019 09:12:57 EDT Ventricular Rate:  77 PR Interval:    QRS Duration: 94 QT Interval:  381 QTC Calculation: 432 R Axis:   87 Text Interpretation: Sinus rhythm 12 Lead; Mason-Likar No previous ECGs available Confirmed by [birth year] [phone]) on 12/14/2019 8:15:54 PM   Radiology CT Head Wo Contrast  Result Date: 12/14/2019 CLINICAL DATA:  Syncope EXAM: CT HEAD WITHOUT CONTRAST TECHNIQUE: Contiguous axial images were obtained from the base of the skull through the vertex without intravenous contrast. COMPARISON:  06/08/2014 FINDINGS: Brain: No acute intracranial abnormality. Specifically, no hemorrhage, hydrocephalus, mass  lesion, acute infarction, or significant intracranial injury. Vascular: No hyperdense vessel or unexpected calcification. Skull: No acute calvarial abnormality. Sinuses/Orbits: Visualized paranasal sinuses and mastoids clear. Orbital soft tissues unremarkable. Other: None IMPRESSION: Normal study. Electronically Signed   By: 06/10/2014 M.D.   On: 12/14/2019 19:03    Procedures Procedures (including critical care time)  Medications Ordered in ED Medications  ondansetron (ZOFRAN-ODT) disintegrating tablet 4 mg (4 mg Oral Given 12/14/19 0922)  sodium chloride 0.9 % bolus 1,000 mL (0 mLs Intravenous Stopped 12/14/19 2013)  sodium chloride 0.9 % bolus 500 mL (500 mLs Intravenous New Bag/Given 12/14/19 2134)    ED Course  I have reviewed the triage vital signs and the nursing notes.  Pertinent labs & imaging results that were available during my care of the patient were reviewed by me and considered in my medical decision making (see chart for details).  Clinical Course as of Dec 13 2298  Mon Dec 14, 2019  2029 Patient reevaluated, reports she felt much better while receiving fluids, however once fluid bolus stopped, she begins feeling lightheaded again.  Orthostatics repeated while I was in the room.  Heart rate stayed normal.  Blood pressure elevated appropriately with position change.  She reports some persistent lightheadedness.  Will give additional fluids.   [JR]    Clinical Course User Index [JR] Ricci Paff, [place] N, PA-C   MDM Rules/Calculators/A&P                          Patient presenting for episodes of syncope that began yesterday with a prodrome of lightheadedness.  She has history of migraine headaches and to the occurrences had some pain behind her eyes prior to the syncope.  She does not believe her syncope is positional.  She has had adequate p.o. intake and no recent illnesses.  She is well-appearing on exam and in no distress.  No tachycardia or hypotension.  Afebrile.  EKG  without arrhythmia.  Labs are unremarkable with normal hemoglobin and electrolytes.  Negative beta-hCG.  Negative urine.    Patient had episode upon reentering the room, she had no seizure-like activity, eyes closed, pupils were equal and reactive on check, vitals remained stable and normal.  No hypotension.  Episode lasted less than a minute.  She was fully oriented and alert immediately following the episode.   After fluids, patient ambulated and orthostatics done. I was present while orthostatics were checked, patients vital signs remained stable, no hypotension or tachycardia. She was given some additional fluids as she reported continued syncope. Given patients recurrent syncopal episodes, Dr. Silverio Lay evaluated patient and offered admission vs outpt cardiology referral for close follow up. Patient would prefer discharge. Discussed reasons to return to the ED.  Ambulatory referral to cardiology also ordered. Discussed with cardiology fellow as well to help with follow up. Patient well-appearing at discharge, ambulating with steady gait.  Discussed results, findings, treatment and follow up. Patient advised of return precautions. Patient verbalized understanding and agreed with plan.   Final Clinical Impression(s) / ED Diagnoses Final diagnoses:  Syncope, unspecified syncope type    Rx / DC Orders ED Discharge Orders         Ordered    Ambulatory referral to Cardiology        12/14/19 2248           Slayter Moorhouse, [place] N, PA-C 12/14/19 2342    Charlynne Pander, MD 12/15/19 [phone]

## 2019-12-14 NOTE — Discharge Instructions (Addendum)
Please continue hydrating and eating a well-balanced diet.  The cardiology clinic should be contacting you regarding an appointment.  If you begin having episodes without a warning, please return to the ED. Please don't hesitate to return should you develop any new or worsening symptoms.

## 2019-12-14 NOTE — ED Notes (Signed)
Patient transported to CT 

## 2019-12-14 NOTE — ED Triage Notes (Signed)
Arrives via EMS from place of work, reports one syncopal episode before EMS arrival and 2 upon EMS arrival. States decent water intake, no orthostatic changes, unremarkable 12 leads with EMS.   Vitals with EMS BP 113/80  CBG 105 O2 100% Ra HR 90

## 2019-12-15 ENCOUNTER — Other Ambulatory Visit: Payer: Self-pay | Admitting: Physician Assistant

## 2019-12-15 DIAGNOSIS — R55 Syncope and collapse: Secondary | ICD-10-CM

## 2019-12-15 LAB — SARS CORONAVIRUS 2 BY RT PCR (HOSPITAL ORDER, PERFORMED IN ~~LOC~~ HOSPITAL LAB): SARS Coronavirus 2: NEGATIVE

## 2019-12-16 ENCOUNTER — Encounter (HOSPITAL_BASED_OUTPATIENT_CLINIC_OR_DEPARTMENT_OTHER): Payer: Self-pay | Admitting: Emergency Medicine

## 2019-12-16 ENCOUNTER — Ambulatory Visit
Admission: EM | Admit: 2019-12-16 | Discharge: 2019-12-16 | Disposition: A | Discharge status: Home / Self Care | Payer: 59

## 2019-12-16 ENCOUNTER — Observation Stay (HOSPITAL_BASED_OUTPATIENT_CLINIC_OR_DEPARTMENT_OTHER)
Admission: EM | Admit: 2019-12-16 | Discharge: 2019-12-18 | Disposition: A | Discharge status: Home / Self Care | Payer: 59 | Attending: Emergency Medicine | Admitting: Emergency Medicine

## 2019-12-16 ENCOUNTER — Other Ambulatory Visit: Payer: Self-pay

## 2019-12-16 DIAGNOSIS — R569 Unspecified convulsions: Secondary | ICD-10-CM | POA: Diagnosis not present

## 2019-12-16 DIAGNOSIS — Z7982 Long term (current) use of aspirin: Secondary | ICD-10-CM | POA: Insufficient documentation

## 2019-12-16 DIAGNOSIS — Z20822 Contact with and (suspected) exposure to covid-19: Secondary | ICD-10-CM | POA: Diagnosis not present

## 2019-12-16 DIAGNOSIS — Z79899 Other long term (current) drug therapy: Secondary | ICD-10-CM | POA: Diagnosis not present

## 2019-12-16 DIAGNOSIS — R55 Syncope and collapse: Secondary | ICD-10-CM

## 2019-12-16 LAB — CBC WITH DIFFERENTIAL/PLATELET
Abs Immature Granulocytes: 0.03 10*3/uL (ref 0.00–0.07)
Basophils Absolute: 0 10*3/uL (ref 0.0–0.1)
Basophils Relative: 0 %
Eosinophils Absolute: 0.1 10*3/uL (ref 0.0–0.5)
Eosinophils Relative: 2 %
HCT: 41.7 % (ref 36.0–46.0)
Hemoglobin: 14.1 g/dL (ref 12.0–15.0)
Immature Granulocytes: 0 %
Lymphocytes Relative: 31 %
Lymphs Abs: 2.5 10*3/uL (ref 0.7–4.0)
MCH: 31.8 pg (ref 26.0–34.0)
MCHC: 33.8 g/dL (ref 30.0–36.0)
MCV: 93.9 fL (ref 80.0–100.0)
Monocytes Absolute: 0.6 10*3/uL (ref 0.1–1.0)
Monocytes Relative: 8 %
Neutro Abs: 4.8 10*3/uL (ref 1.7–7.7)
Neutrophils Relative %: 59 %
Platelets: 293 10*3/uL (ref 150–400)
RBC: 4.44 MIL/uL (ref 3.87–5.11)
RDW: 12.1 % (ref 11.5–15.5)
WBC: 8 10*3/uL (ref 4.0–10.5)
nRBC: 0 % (ref 0.0–0.2)

## 2019-12-16 LAB — BASIC METABOLIC PANEL
Anion gap: 9 (ref 5–15)
BUN: 14 mg/dL (ref 6–20)
CO2: 25 mmol/L (ref 22–32)
Calcium: 8.9 mg/dL (ref 8.9–10.3)
Chloride: 102 mmol/L (ref 98–111)
Creatinine, Ser: 0.73 mg/dL (ref 0.44–1.00)
GFR calc Af Amer: >60 mL/min (ref >60)
GFR calc non Af Amer: >60 mL/min (ref >60)
Glucose, Bld: 93 mg/dL (ref 70–99)
Potassium: 3.8 mmol/L (ref 3.5–5.1)
Sodium: 136 mmol/L (ref 135–145)

## 2019-12-16 LAB — SARS CORONAVIRUS 2 BY RT PCR (HOSPITAL ORDER, PERFORMED IN ~~LOC~~ HOSPITAL LAB): SARS Coronavirus 2: NEGATIVE

## 2019-12-16 NOTE — ED Notes (Signed)
Pt in waiting room with friend. Pt slumped in chair with head against wall, did not respond when name called. Radial pulse strong 2. Friend states pt has been "passing out" the past few days and was evaluated at ER for same and that pt "can hear and know what's going on, but can't respond". Pt quickly responded to alcohol placed under nose. And was immediately awake/alert/oriented x4. Denies HA, dizziness, extremity weakness or other c/o. States has been drinking approx 1 gallon of water/day, ate two cheeseburgers and fries just PTA. Advised pt to go to ER of choice for further eval.

## 2019-12-16 NOTE — ED Notes (Signed)
Ambulated to bathroom with steady gait. Reports mild dizziness initially when getting up from lying position.

## 2019-12-16 NOTE — ED Triage Notes (Signed)
Pt here with recurring syncopal episodes. Pt is still able to hear and twitches and withdraws from pain stimulus during these "episodes" VS stable

## 2019-12-16 NOTE — ED Notes (Signed)
Pt on monitor 

## 2019-12-16 NOTE — ED Provider Notes (Signed)
[facility] EMERGENCY DEPARTMENT Provider Note   CSN: [id number] Arrival date & time: 12/16/19  1426     History Chief Complaint  Patient presents with  . Near Syncope    Kelly Morton is a 24 y.o. female with PMH of PTSD and migraines who presents the ED with complaints of continued episodes of syncope.  Patient was evaluated on 12/14/2019 and had extensive laboratory work-up and imaging of head obtained which was all unremarkable.  She states that in the past 4 days, she has had approximately 40 episodes.  She is accompanied by her girlfriend who is at bedside.  She states that she has a prodrome of room spinning dizziness immediately prior to her episodes of syncope which last anywhere from 15-90 seconds.  Girlfriend reports that patient says "mustard" when she is about to have an episode.  Patient reports that she loses consciousness and bodily control, but also can hear what we are saying.  She can tell me what I said to her during her episode (I have observed in the room), but she cannot answer me or follow instructions during her episode.  She states that she has had episodes of syncope in the past, but related to iron deficiency anemia or to hypoglycemia.  She states that she has never had anything like this happen to her before.  She denies any obvious precipitating illness or factors.  Patient denies any persisting neurologic deficits, histories of seizures or seizure disorder, tongue biting or incontinence, numbness or weakness, inability to ambulate, congestion, ear pain, hearing loss, or other symptoms.  She returns to the ED because she was evaluated by a virtual provider yesterday who strongly encouraged her to seek referral to neurology for ongoing evaluation and management.  She has a Zio patch that will be arriving to her home in the next 36 to 48 hours.  She already has an appointment scheduled with cardiology.  Patient is fully vaccinated for COVID-19.  HPI      Past Medical History:  Diagnosis Date  . Ovarian cyst   . PTSD (post-traumatic stress disorder)     There are no problems to display for this patient.   Past Surgical History:  Procedure Laterality Date  . OOPHORECTOMY    . TONSILLECTOMY       OB History   No obstetric history on file.     Family History  Problem Relation Age of Onset  . Hypertension Mother   . Stroke Mother   . Hypertension Father     Social History   Tobacco Use  . Smoking status: Never Smoker  . Smokeless tobacco: Never Used  Substance Use Topics  . Alcohol use: Yes    Alcohol/week: 6.0 standard drinks    Types: 6 Glasses of wine per week  . Drug use: Never    Home Medications Prior to Admission medications   Medication Sig Start Date End Date Taking? Authorizing Provider  acetaminophen (TYLENOL) 325 MG tablet Take 650 mg by mouth every 6 (six) hours as needed for mild pain.   Yes [provider]  aspirin-acetaminophen-caffeine (EXCEDRIN MIGRAINE) [phone] MG tablet Take 3 tablets by mouth every 6 (six) hours as needed for headache or migraine.    Yes [provider]  etonogestrel (NEXPLANON) 68 MG IMPL implant 1 each by Subdermal route once.    Yes [provider]  ibuprofen (ADVIL) 600 MG tablet Take 600 mg by mouth every 6 (six) hours as needed for moderate pain or  cramping.   Yes [provider]  dicyclomine (BENTYL) 20 MG tablet Take 1 tablet (20 mg total) by mouth 2 (two) times daily. Patient not taking: Reported on 03/05/2018 03/21/16 10/02/19  Renne Crigler, PA-C  famotidine (PEPCID) 20 MG tablet Take 1 tablet (20 mg total) by mouth 2 (two) times daily. Patient not taking: Reported on 03/05/2018 03/21/16 10/02/19  Renne Crigler, PA-C    Allergies    Bactrim [sulfamethoxazole-trimethoprim] and Sulfur  Review of Systems   Review of Systems  All other systems reviewed and are negative.   Physical Exam Updated Vital Signs BP 111/77   Pulse  74   Temp 98.9 F (37.2 C)   Resp 11   SpO2 99%   Physical Exam Vitals and nursing note reviewed. Exam conducted with a chaperone present.  Constitutional:      General: She is not in acute distress.    Appearance: Normal appearance. She is not ill-appearing.  HENT:     Head: Normocephalic and atraumatic.  Eyes:     General: No scleral icterus.    Extraocular Movements: Extraocular movements intact.     Conjunctiva/sclera: Conjunctivae normal.     Pupils: Pupils are equal, round, and reactive to light.     Comments: No nystagmus.  All EOMs intact.  PERRLA.  No photosensitivity.  Neck:     Comments: No meningismus. Cardiovascular:     Rate and Rhythm: Normal rate and regular rhythm.     Pulses: Normal pulses.     Heart sounds: Normal heart sounds.  Pulmonary:     Effort: Pulmonary effort is normal. No respiratory distress.     Breath sounds: Normal breath sounds.  Musculoskeletal:     Cervical back: Normal range of motion. No rigidity.     Right lower leg: No edema.     Left lower leg: No edema.  Skin:    General: Skin is dry.     Capillary Refill: Capillary refill takes less than 2 seconds.  Neurological:     Mental Status: She is alert.     GCS: GCS eye subscore is 4. GCS verbal subscore is 5. GCS motor subscore is 6.     Comments: CN II through XII grossly intact.  Ambulates without ataxia or listing.  Alert and oriented x3.  No memory disturbance.  No postictal confusion.  Coordination intact.  Psychiatric:        Mood and Affect: Mood normal.        Behavior: Behavior normal.        Thought Content: Thought content normal.     ED Results / Procedures / Treatments   Labs (all labs ordered are listed, but only abnormal results are displayed) Labs Reviewed  SARS CORONAVIRUS 2 BY RT PCR (HOSPITAL ORDER, PERFORMED IN  HOSPITAL LAB)  BASIC METABOLIC PANEL  CBC WITH DIFFERENTIAL/PLATELET    EKG EKG Interpretation  Date/Time:  Wednesday December 16 2019  15:06:44 EDT Ventricular Rate:  81 PR Interval:    QRS Duration: 83 QT Interval:  366 QTC Calculation: 425 R Axis:   90 Text Interpretation: Sinus rhythm Borderline right axis deviation No significant change since last tracing Confirmed by Gwyneth Sprout ([id number]) on 12/16/2019 4:12:30 PM   Radiology CT Head Wo Contrast  Result Date: 12/14/2019 CLINICAL DATA:  Syncope EXAM: CT HEAD WITHOUT CONTRAST TECHNIQUE: Contiguous axial images were obtained from the base of the skull through the vertex without intravenous contrast. COMPARISON:  06/08/2014 FINDINGS: Brain: No acute intracranial  abnormality. Specifically, no hemorrhage, hydrocephalus, mass lesion, acute infarction, or significant intracranial injury. Vascular: No hyperdense vessel or unexpected calcification. Skull: No acute calvarial abnormality. Sinuses/Orbits: Visualized paranasal sinuses and mastoids clear. Orbital soft tissues unremarkable. Other: None IMPRESSION: Normal study. Electronically Signed   By: Charlett Nose M.D.   On: 12/14/2019 19:03    Procedures Procedures (including critical care time)  Medications Ordered in ED Medications - No data to display  ED Course  I have reviewed the triage vital signs and the nursing notes.  Pertinent labs & imaging results that were available during my care of the patient were reviewed by me and considered in my medical decision making (see chart for details).  Clinical Course as of Dec 15 1832  Wed Dec 16, 2019  1814 Spoke with Dr. Otelia Limes who will round on patient once admitted to [facility] to evaluate her further regarding her brief, frequent episodes of brief unresponsiveness.    [GG]  [phone] Spoke with Dr. Cyndia Bent who will admit patient to telemetry bed at [facility].    [GG]    Clinical Course User Index [GG] Lorelee New, PA-C   MDM Rules/Calculators/A&P                          Patient presents the ED for recurrent syncope versus seizure-like episodes that have been  occurring with high frequency over the course of the past 4 days.  She and her girlfriend reports that she has many episodes daily and she has now been evaluated on 4 separate occasions.  After she left the ER on 12/15/2019, she was evaluated by a virtual telehealth professional later that day and once more at an urgent care this morning prior to arrival.  Patient and her girlfriend are both concerned because she is having a progressively increasing frequency of these undifferentiated episodes.  Unsure as to seizure versus syncope.  I agree that patient would benefit from EEG.  Patient was personally evaluated by Dr. Anitra Lauth and given her increased frequency of episodes and concern for falls, we feel as though she should be admitted to the hospital for ongoing evaluation and management - including EEG.  Will obtain basic laboratory work-up and then consult neurology before ultimately admitting to hospitalist services.  She may also benefit from tilt table test of even echocardiogram.  While this could be provoked by her anxiety/PTSD, ultimately that is a dx of exclusion and patient has never previously been worked up for these symptoms.   Spoke with Dr. Otelia Limes who will round on patient once admitted to [facility] to evaluate her further regarding her brief, frequent episodes of brief unresponsiveness.   Spoke with Dr. Cyndia Bent who will admit patient to telemetry bed at [facility].    Final Clinical Impression(s) / ED Diagnoses Final diagnoses:  Syncope, unspecified syncope type    Rx / DC Orders ED Discharge Orders    None       Lorelee New, PA-C 12/16/19 Harrington Challenger, MD 12/16/19 [phone]

## 2019-12-17 ENCOUNTER — Observation Stay (HOSPITAL_BASED_OUTPATIENT_CLINIC_OR_DEPARTMENT_OTHER): Payer: 59

## 2019-12-17 ENCOUNTER — Observation Stay (HOSPITAL_COMMUNITY): Payer: 59

## 2019-12-17 ENCOUNTER — Encounter (HOSPITAL_COMMUNITY): Payer: Self-pay | Admitting: Family Medicine

## 2019-12-17 DIAGNOSIS — R569 Unspecified convulsions: Secondary | ICD-10-CM

## 2019-12-17 DIAGNOSIS — R55 Syncope and collapse: Secondary | ICD-10-CM

## 2019-12-17 DIAGNOSIS — G43909 Migraine, unspecified, not intractable, without status migrainosus: Secondary | ICD-10-CM | POA: Diagnosis not present

## 2019-12-17 LAB — COMPREHENSIVE METABOLIC PANEL
ALT: 12 U/L (ref 0–44)
AST: 13 U/L — ABNORMAL LOW (ref 15–41)
Albumin: 3.9 g/dL (ref 3.5–5.0)
Alkaline Phosphatase: 43 U/L (ref 38–126)
Anion gap: 12 (ref 5–15)
BUN: 11 mg/dL (ref 6–20)
CO2: 22 mmol/L (ref 22–32)
Calcium: 8.9 mg/dL (ref 8.9–10.3)
Chloride: 103 mmol/L (ref 98–111)
Creatinine, Ser: 0.73 mg/dL (ref 0.44–1.00)
GFR calc Af Amer: >60 mL/min (ref >60)
GFR calc non Af Amer: >60 mL/min (ref >60)
Glucose, Bld: 100 mg/dL — ABNORMAL HIGH (ref 70–99)
Potassium: 4 mmol/L (ref 3.5–5.1)
Sodium: 137 mmol/L (ref 135–145)
Total Bilirubin: 0.7 mg/dL (ref 0.3–1.2)
Total Protein: 6.9 g/dL (ref 6.5–8.1)

## 2019-12-17 LAB — ECHOCARDIOGRAM COMPLETE
Area-P 1/2: 3.03 cm2
Height: 68 in
S' Lateral: 2.6 cm
Weight: 2985.6 oz

## 2019-12-17 LAB — TSH: TSH: 5.819 u[IU]/mL — ABNORMAL HIGH (ref 0.350–4.500)

## 2019-12-17 LAB — CBC WITH DIFFERENTIAL/PLATELET
Abs Immature Granulocytes: 0.01 10*3/uL (ref 0.00–0.07)
Basophils Absolute: 0 10*3/uL (ref 0.0–0.1)
Basophils Relative: 0 %
Eosinophils Absolute: 0.1 10*3/uL (ref 0.0–0.5)
Eosinophils Relative: 2 %
HCT: 42 % (ref 36.0–46.0)
Hemoglobin: 13.7 g/dL (ref 12.0–15.0)
Immature Granulocytes: 0 %
Lymphocytes Relative: 41 %
Lymphs Abs: 2.8 10*3/uL (ref 0.7–4.0)
MCH: 30.9 pg (ref 26.0–34.0)
MCHC: 32.6 g/dL (ref 30.0–36.0)
MCV: 94.8 fL (ref 80.0–100.0)
Monocytes Absolute: 0.6 10*3/uL (ref 0.1–1.0)
Monocytes Relative: 9 %
Neutro Abs: 3.3 10*3/uL (ref 1.7–7.7)
Neutrophils Relative %: 48 %
Platelets: 247 10*3/uL (ref 150–400)
RBC: 4.43 MIL/uL (ref 3.87–5.11)
RDW: 12.2 % (ref 11.5–15.5)
WBC: 6.7 10*3/uL (ref 4.0–10.5)
nRBC: 0 % (ref 0.0–0.2)

## 2019-12-17 LAB — RAPID URINE DRUG SCREEN, HOSP PERFORMED
Amphetamines: NOT DETECTED
Barbiturates: NOT DETECTED
Benzodiazepines: NOT DETECTED
Cocaine: NOT DETECTED
Opiates: NOT DETECTED
Tetrahydrocannabinol: POSITIVE — AB

## 2019-12-17 LAB — MAGNESIUM: Magnesium: 2.2 mg/dL (ref 1.7–2.4)

## 2019-12-17 LAB — HIV ANTIBODY (ROUTINE TESTING W REFLEX): HIV Screen 4th Generation wRfx: NONREACTIVE

## 2019-12-17 MED ORDER — ENOXAPARIN SODIUM 40 MG/0.4ML ~~LOC~~ SOLN
40.0000 mg | SUBCUTANEOUS | Status: DC
  Administered 2019-12-17: 40 mg via SUBCUTANEOUS
  Filled 2019-12-17: qty 0.4

## 2019-12-17 MED ORDER — ASPIRIN-ACETAMINOPHEN-CAFFEINE 250-250-65 MG PO TABS
2.0000 | ORAL_TABLET | Freq: Four times a day (QID) | ORAL | Status: DC | PRN
  Filled 2019-12-17 (×2): qty 2

## 2019-12-17 MED ORDER — ONDANSETRON HCL 4 MG PO TABS: 4.0000 mg | ORAL_TABLET | Freq: Four times a day (QID) | ORAL | Status: DC | PRN

## 2019-12-17 MED ORDER — GADOBUTROL 1 MMOL/ML IV SOLN
7.0000 mL | Freq: Once | INTRAVENOUS | Status: AC | PRN
  Administered 2019-12-17: 7 mL via INTRAVENOUS

## 2019-12-17 MED ORDER — ONDANSETRON HCL 4 MG/2ML IJ SOLN: 4.0000 mg | Freq: Four times a day (QID) | INTRAMUSCULAR | Status: DC | PRN

## 2019-12-17 MED ORDER — POLYETHYLENE GLYCOL 3350 17 G PO PACK: 17.0000 g | PACK | Freq: Every day | ORAL | Status: DC | PRN

## 2019-12-17 MED ORDER — ACETAMINOPHEN 650 MG RE SUPP: 650.0000 mg | Freq: Four times a day (QID) | RECTAL | Status: DC | PRN

## 2019-12-17 MED ORDER — ACETAMINOPHEN 325 MG PO TABS: 650.0000 mg | ORAL_TABLET | Freq: Four times a day (QID) | ORAL | Status: DC | PRN

## 2019-12-17 NOTE — H&P (Signed)
History and Physical    Kelly Morton MOQ:[id number] DOB: 09-04-1995 DOA: 12/16/2019  PCP: System, Pcp Not In  Patient coming from: Home   Chief Complaint:   Episodes of seizure-like activity  HPI:    24 year old female with past medical history of migraine headaches, anxiety disorder, posttraumatic stress disorder, who presents to [facility] as a transfer from [facility] after experiencing numerous episodes of seizure-like activity over the past several days.  History has been obtained from the patient who is a good historian.  Patient explains that on Sunday afternoon she was having lunch at a local restaurant with her girlfriend when she suddenly began to have an episode of what she describes as "eye heaviness."    Patient describes these episodes as lasting between 1 and 2 minutes, occurring frequently, does not sometimes in the days that followed.  Patient states that during each episode her eyes feel extremely heavy resulting in her closing them and while she does perceive her surroundings she loses control of her extremities.  Patient states that the majority of these episodes occur while she is sitting up.  Patient denies ever losing consciousness during any of these episodes.  Patient did have an episode on Tuesday resulting in her falling and striking her head on a hard surface.  Patient states that the longest episode she has experienced has been approximately 2 minutes and 20 seconds times by her girlfriend.  Patient denies any self defecation, self urination or tongue biting during any of these episodes.   Upon further questioning, patient denies illicit drug use, denies heavy alcohol use.  Patient denies fevers, sick contacts, recent travel or confirmed contact with known COVID-19 infection.  Patient denies recently starting any new prescription medications.  Patient had presented on 8/23 to [facility] where she underwent work-up including CT head.   After work-up was found to be negative patient was encouraged to follow-up as an outpatient with cardiology for tilt table test and echocardiography and the patient was discharged home.  The days that followed, patient continued to experience frequent episodes prompting her presentation to med [facility] on 8/25.  Due to patient's ongoing symptoms and unclear etiology hospitalist group was called to accept patient to [facility] medical floor for further evaluation.    Review of Systems:   Review of Systems  Neurological: Positive for headaches.       Seizure like activity  All other systems reviewed and are negative.    Past Medical History:  Diagnosis Date  . Ovarian cyst   . PTSD (post-traumatic stress disorder)     Past Surgical History:  Procedure Laterality Date  . OOPHORECTOMY    . TONSILLECTOMY       reports that she has never smoked. She has never used smokeless tobacco. She reports current alcohol use of about 6.0 standard drinks of alcohol per week. She reports that she does not use drugs.  Allergies  Allergen Reactions  . Bactrim [Sulfamethoxazole-Trimethoprim] Hives  . Sulfur Hives    Family History  Problem Relation Age of Onset  . Hypertension Mother   . Stroke Mother   . Hypertension Father      Prior to Admission medications   Medication Sig Start Date End Date Taking? Authorizing Provider  acetaminophen (TYLENOL) 325 MG tablet Take 650 mg by mouth every 6 (six) hours as needed for mild pain.   Yes [provider]  aspirin-acetaminophen-caffeine (EXCEDRIN MIGRAINE) 860 120 0238 MG tablet Take  3 tablets by mouth every 6 (six) hours as needed for headache or migraine.    Yes [provider]  etonogestrel (NEXPLANON) 68 MG IMPL implant 1 each by Subdermal route once.    Yes [provider]  ibuprofen (ADVIL) 600 MG tablet Take 600 mg by mouth every 6 (six) hours as needed for moderate pain or cramping.   Yes  [provider]  dicyclomine (BENTYL) 20 MG tablet Take 1 tablet (20 mg total) by mouth 2 (two) times daily. Patient not taking: Reported on 03/05/2018 03/21/16 10/02/19  Renne Crigler, PA-C  famotidine (PEPCID) 20 MG tablet Take 1 tablet (20 mg total) by mouth 2 (two) times daily. Patient not taking: Reported on 03/05/2018 03/21/16 10/02/19  Renne Crigler, PA-C    Physical Exam: Vitals:   12/16/19 2000 12/16/19 2200 12/16/19 2300 12/17/19 0058  BP: 120/83 113/66 100/71 123/82  Pulse: 63 67 (!) 58 63  Resp: 14 16 14 18   Temp:    98.3 F (36.8 C)  TempSrc:    Oral  SpO2: 100% 98% 98% 99%  Weight:    84.6 kg  Height:    5\' 8"  (1.727 m)   Of note, patient did exhibit an episode of this seizure-like activity while I was in the room.  Patient is eyes closed and fluttering somewhat as she fell backwards from a seated position onto her bed.  Episode lasted approximately 30 seconds.  There was no evidence of tonic-clonic seizure activity.  Patient immediately regained normal neurologic function afterwards.  Constitutional: Acute alert and oriented x3, no associated distress.   Skin: no rashes, no lesions, good skin turgor noted. Eyes: Pupils are equally reactive to light.  No evidence of scleral icterus or conjunctival pallor.  ENMT: Moist mucous membranes noted.  Posterior pharynx clear of any exudate or lesions.   Neck: normal, supple, no masses, no thyromegaly.  No evidence of jugular venous distension.   Respiratory: clear to auscultation bilaterally, no wheezing, no crackles. Normal respiratory effort. No accessory muscle use.  Cardiovascular: Regular rate and rhythm, no murmurs / rubs / gallops. No extremity edema. 2+ pedal pulses. No carotid bruits.  Chest:   Nontender without crepitus or deformity.   Back:   Nontender without crepitus or deformity. Abdomen: Abdomen is soft and nontender.  No evidence of intra-abdominal masses.  Positive bowel sounds noted in all quadrants.     Musculoskeletal: No joint deformity upper and lower extremities. Good ROM, no contractures. Normal muscle tone.  Neurologic: CN 2-12 grossly intact. Sensation intact, strength noted to be 5 out of 5 in all 4 extremities.  Patient is following all commands.  Patient is responsive to verbal stimuli.   Psychiatric: Patient presents as a normal mood with appropriate affect.  Patient seems to possess insight as to theircurrent situation.     Labs on Admission: I have personally reviewed following labs and imaging studies -   CBC: Recent Labs  Lab 12/14/19 0929 12/16/19 1824  WBC 5.1 8.0  NEUTROABS  --  4.8  HGB 13.5 14.1  HCT 40.1 41.7  MCV 94.8 93.9  PLT 256 293   Basic Metabolic Panel: Recent Labs  Lab 12/14/19 0929 12/16/19 1824  NA 137 136  K 3.9 3.8  CL 107 102  CO2 22 25  GLUCOSE 102* 93  BUN 11 14  CREATININE 0.63 0.73  CALCIUM 8.6* 8.9   GFR: Estimated Creatinine Clearance: 123.6 mL/min (by C-G formula based on SCr of 0.73 mg/dL). Liver  Function Tests: No results for input(s): AST, ALT, ALKPHOS, BILITOT, PROT, ALBUMIN in the last 168 hours. No results for input(s): LIPASE, AMYLASE in the last 168 hours. No results for input(s): AMMONIA in the last 168 hours. Coagulation Profile: No results for input(s): INR, PROTIME in the last 168 hours. Cardiac Enzymes: No results for input(s): CKTOTAL, CKMB, CKMBINDEX, TROPONINI in the last 168 hours. BNP (last 3 results) No results for input(s): PROBNP in the last 8760 hours. HbA1C: No results for input(s): HGBA1C in the last 72 hours. CBG: Recent Labs  Lab 12/14/19 0914  GLUCAP 107*   Lipid Profile: No results for input(s): CHOL, HDL, LDLCALC, TRIG, CHOLHDL, LDLDIRECT in the last 72 hours. Thyroid Function Tests: No results for input(s): TSH, T4TOTAL, FREET4, T3FREE, THYROIDAB in the last 72 hours. Anemia Panel: No results for input(s): VITAMINB12, FOLATE, FERRITIN, TIBC, IRON, RETICCTPCT in the last 72  hours. Urine analysis:    Component Value Date/Time   COLORURINE YELLOW 12/14/2019 1821   APPEARANCEUR CLEAR 12/14/2019 1821   LABSPEC 1.017 12/14/2019 1821   PHURINE 6.0 12/14/2019 1821   GLUCOSEU NEGATIVE 12/14/2019 1821   HGBUR NEGATIVE 12/14/2019 1821   BILIRUBINUR NEGATIVE 12/14/2019 1821   KETONESUR NEGATIVE 12/14/2019 1821   PROTEINUR NEGATIVE 12/14/2019 1821   NITRITE NEGATIVE 12/14/2019 1821   LEUKOCYTESUR NEGATIVE 12/14/2019 1821    Radiological Exams on Admission - Personally Reviewed: No results found.  EKG: Personally reviewed.  Rhythm is normal sinus rhythm with heart rate of 81 bpm.  No dynamic ST segment changes appreciated.  Assessment/Plan Principal Problem:   Seizure-like activity [facility])   Patient presenting with dozens of episodes of seizure-like activity in the past 4 days.  I personally witnessed one episode lasting approximately 30 seconds while I was in the room with the patient.  Patient did not completely lose consciousness during the episode and did not exhibit any tonic-clonic seizure-like activity.  Etiology of these episodes is unclear although psychogenic seizures is a possibility.  Cardiogenic cause is also possible but less less likely.  Monitoring patient on telemetry  I discussed the case with Dr. Jerrell Belfast with neurology who has also evaluated the patient at the bedside.  He also had the opportunity to witness 1 of these episodes while he was in the room.   Dr. Jerrell Belfast and I agree that patient should undergo MRI and echocardiography in the morning.  Additionally ordering EEG for the morning   Avoiding antiepileptics or as needed benzodiazepines unless patient exhibits obvious signs of a prolonged seizure in order to not obscure upcoming EEG findings.  Obtaining urine toxicology screen, TSH   Code Status:  Full code Family Communication: deferred   Status is: Observation  The patient remains OBS appropriate and will d/c before 2  midnights.  Dispo: The patient is from: Home              Anticipated d/c is to: Home              Anticipated d/c date is: 2 days              Patient currently is not medically stable to d/c.        Marinda Elk MD [facility] Pager [phone]  If 7PM-7AM, please contact night-coverage [email or web address] Use universal [place] password for that web site. If you do not have the password, please call the hospital operator.  12/17/2019, 2:04 AM

## 2019-12-17 NOTE — Progress Notes (Signed)
  Echocardiogram 2D Echocardiogram has been performed.  Leta Jungling M 12/17/2019, 9:26 AM

## 2019-12-17 NOTE — Progress Notes (Signed)
[facility] PROGRESS NOTE    Progress Note  Kelly BeltonJessica Morton  UJW:[id number] DOB: 04/05/1996 DOA: 12/16/2019 PCP: System, Pcp Not In     Brief Narrative:   Kelly BeltonJessica Morton is an 24 y.o. female past medical history of headache anxiety posttraumatic stress disorder [facility] ED [facility] due to numerous seizure-like activities over the last several days.  Assessment/Plan:   Principal Problem:   Seizure-like activity [facility](HCC) Neurology was consulted recommended a 2D echo and an MRI and EEG this morning. MRI was negative for any acute findings. UDS was positive for marijuana. Avoid antiepileptic drug and benzodiazepines illicit patient exhibits obvious signs of seizures. She had an episode while I was in the room lasted about 15 seconds, she was postictal afterwards, she did not bite her tongue or lose control of her sphincters.    DVT prophylaxis: lovenox Family Communication:none Status is: Observation  The patient remains OBS appropriate and will d/c before 2 midnights.  Dispo: The patient is from: Home              Anticipated d/c is to: Home              Anticipated d/c date is: 1 day              Patient currently is medically stable to d/c.        Code Status:     Code Status Orders  (From admission, onward)         Start     Ordered   12/17/19 0201  Full code  Continuous        12/17/19 0203        Code Status History    This patient has a current code status but no historical code status.   Advance Care Planning Activity        IV Access:    Peripheral IV   Procedures and diagnostic studies:   MR BRAIN W WO CONTRAST  Result Date: 12/17/2019 CLINICAL DATA:  Simple syncope with normal neuro exam. Syncope during orthostatics assessment. EXAM: MRI HEAD WITHOUT AND WITH CONTRAST TECHNIQUE: Multiplanar, multiecho pulse sequences of the brain and surrounding structures were obtained without and with intravenous  contrast. CONTRAST:  7mL GADAVIST GADOBUTROL 1 MMOL/ML IV SOLN COMPARISON:  Head CT from 3 days ago FINDINGS: Brain: No infarction, hemorrhage, hydrocephalus, extra-axial collection or mass lesion. No white matter disease or atrophy. No abnormal intracranial enhancement. Vascular: Normal flow voids and vascular enhancements Skull and upper cervical spine: Normal marrow signal Sinuses/Orbits: Negative IMPRESSION: Normal brain MRI. Electronically Signed   By: Marnee SpringJonathon  Watts M.D.   On: 12/17/2019 07:02     Medical Consultants:    None.  Anti-Infectives:   none  Subjective:    Kelly Morton no complaints.  Objective:    Vitals:   12/16/19 2200 12/16/19 2300 12/17/19 0058 12/17/19 0450  BP: 113/66 100/71 123/82 117/77  Pulse: 67 (!) 58 63 (!) 55  Resp: 16 14 18 18   Temp:   98.3 F (36.8 C) 98.1 F (36.7 C)  TempSrc:   Oral Oral  SpO2: 98% 98% 99% 100%  Weight:   84.6 kg   Height:   5\' 8"  (1.727 m)    SpO2: 100 %   Intake/Output Summary (Last 24 hours) at 12/17/2019 0728 Last data filed at 12/17/2019 0200 Gross per 24 hour  Intake 222 ml  Output 100 ml  Net 122 ml   [employer]  12/17/19 0058  Weight: 84.6 kg    Exam: General exam: In no acute distress. Respiratory system: Good air movement and clear to auscultation. Cardiovascular system: S1 & S2 heard, RRR. No JVD. Gastrointestinal system: Abdomen is nondistended, soft and nontender.  Extremities: No pedal edema. Skin: No rashes, lesions or ulcers Psychiatry: Judgement and insight appear normal. Mood & affect appropriate.    Data Reviewed:    Labs: Basic Metabolic Panel: Recent Labs  Lab 12/14/19 0929 12/14/19 0929 12/16/19 1824 12/17/19 0459  NA 137  --  136 137  K 3.9   < > 3.8 4.0  CL 107  --  102 103  CO2 22  --  25 22  GLUCOSE 102*  --  93 100*  BUN 11  --  14 11  CREATININE 0.63  --  0.73 0.73  CALCIUM 8.6*  --  8.9 8.9  MG  --   --   --  2.2   < > = values in this interval not  displayed.   GFR Estimated Creatinine Clearance: 123.6 mL/min (by C-G formula based on SCr of 0.73 mg/dL). Liver Function Tests: Recent Labs  Lab 12/17/19 0459  AST 13*  ALT 12  ALKPHOS 43  BILITOT 0.7  PROT 6.9  ALBUMIN 3.9   No results for input(s): LIPASE, AMYLASE in the last 168 hours. No results for input(s): AMMONIA in the last 168 hours. Coagulation profile No results for input(s): INR, PROTIME in the last 168 hours. COVID-19 Labs  No results for input(s): DDIMER, FERRITIN, LDH, CRP in the last 72 hours.  Lab Results  Component Value Date   SARSCOV2NAA NEGATIVE 12/16/2019   SARSCOV2NAA NEGATIVE 12/14/2019   SARSCOV2NAA Not Detected 12/08/2019    CBC: Recent Labs  Lab 12/14/19 0929 12/16/19 1824 12/17/19 0459  WBC 5.1 8.0 6.7  NEUTROABS  --  4.8 3.3  HGB 13.5 14.1 13.7  HCT 40.1 41.7 42.0  MCV 94.8 93.9 94.8  PLT 256 293 247   Cardiac Enzymes: No results for input(s): CKTOTAL, CKMB, CKMBINDEX, TROPONINI in the last 168 hours. BNP (last 3 results) No results for input(s): PROBNP in the last 8760 hours. CBG: Recent Labs  Lab 12/14/19 0914  GLUCAP 107*   D-Dimer: No results for input(s): DDIMER in the last 72 hours. Hgb A1c: No results for input(s): HGBA1C in the last 72 hours. Lipid Profile: No results for input(s): CHOL, HDL, LDLCALC, TRIG, CHOLHDL, LDLDIRECT in the last 72 hours. Thyroid function studies: Recent Labs    12/17/19 0459  TSH 5.819*   Anemia work up: No results for input(s): VITAMINB12, FOLATE, FERRITIN, TIBC, IRON, RETICCTPCT in the last 72 hours. Sepsis Labs: Recent Labs  Lab 12/14/19 0929 12/16/19 1824 12/17/19 0459  WBC 5.1 8.0 6.7   Microbiology Recent Results (from the past 240 hour(s))  Novel Coronavirus, NAA ([facility])     Status: None   Collection Time: 12/08/19  3:07 PM   Specimen: Nasopharyngeal(NP) swabs in vial transport medium   Nasopharynge  Screenin  Result Value Ref Range Status   SARS-CoV-2, NAA Not  Detected Not Detected Final    Comment: This nucleic acid amplification test was developed and its performance characteristics determined by [employer]. Nucleic acid amplification tests include RT-PCR and TMA. This test has not been FDA cleared or approved. This test has been authorized by FDA under an Emergency Use Authorization (EUA). This test is only authorized for the duration of time the declaration that circumstances exist justifying the authorization  of the emergency use of in vitro diagnostic tests for detection of SARS-CoV-2 virus and/or diagnosis of COVID-19 infection under section 564(b)(1) of the Act, 21 U.S.C. 834HDQ-2(I) (1), unless the authorization is terminated or revoked sooner. When diagnostic testing is negative, the possibility of a false negative result should be considered in the context of a patient's recent exposures and the presence of clinical signs and symptoms consistent with COVID-19. An individual without symptoms of COVID-19 and who is not shedding SARS-CoV-2 virus wo uld expect to have a negative (not detected) result in this assay.   SARS-COV-2, NAA 2 DAY TAT     Status: None   Collection Time: 12/08/19  3:07 PM   Nasopharynge  Screenin  Result Value Ref Range Status   SARS-CoV-2, NAA 2 DAY TAT Performed  Final  SARS Coronavirus 2 by RT PCR (hospital order, performed in [facility] hospital lab) Nasopharyngeal Nasopharyngeal Swab     Status: None   Collection Time: 12/14/19 11:07 PM   Specimen: Nasopharyngeal Swab  Result Value Ref Range Status   SARS Coronavirus 2 NEGATIVE NEGATIVE Final    Comment: (NOTE) SARS-CoV-2 target nucleic acids are NOT DETECTED.  The SARS-CoV-2 RNA is generally detectable in upper and lower respiratory specimens during the acute phase of infection. The lowest concentration of SARS-CoV-2 viral copies this assay can detect is 250 copies / mL. A negative result does not preclude SARS-CoV-2 infection and should  not be used as the sole basis for treatment or other patient management decisions.  A negative result may occur with improper specimen collection / handling, submission of specimen other than nasopharyngeal swab, presence of viral mutation(s) within the areas targeted by this assay, and inadequate number of viral copies (<250 copies / mL). A negative result must be combined with clinical observations, patient history, and epidemiological information.  Fact Sheet for Patients:   BoilerBrush.com.cy  Fact Sheet for Healthcare Providers: https://pope.com/  This test is not yet approved or  cleared by the [place] FDA and has been authorized for detection and/or diagnosis of SARS-CoV-2 by FDA under an Emergency Use Authorization (EUA).  This EUA will remain in effect (meaning this test can be used) for the duration of the COVID-19 declaration under Section 564(b)(1) of the Act, 21 U.S.C. section 360bbb-3(b)(1), unless the authorization is terminated or revoked sooner.  Performed at [facility], [address] [place], [place] [address]   SARS Coronavirus 2 by RT PCR (hospital order, performed in [facility] hospital lab) Nasopharyngeal Nasopharyngeal Swab     Status: None   Collection Time: 12/16/19  6:24 PM   Specimen: Nasopharyngeal Swab  Result Value Ref Range Status   SARS Coronavirus 2 NEGATIVE NEGATIVE Final    Comment: (NOTE) SARS-CoV-2 target nucleic acids are NOT DETECTED.  The SARS-CoV-2 RNA is generally detectable in upper and lower respiratory specimens during the acute phase of infection. The lowest concentration of SARS-CoV-2 viral copies this assay can detect is 250 copies / mL. A negative result does not preclude SARS-CoV-2 infection and should not be used as the sole basis for treatment or other patient management decisions.  A negative result may occur with improper specimen collection /  handling, submission of specimen other than nasopharyngeal swab, presence of viral mutation(s) within the areas targeted by this assay, and inadequate number of viral copies (<250 copies / mL). A negative result must be combined with clinical observations, patient history, and epidemiological information.  Fact Sheet for Patients:  BoilerBrush.com.cy  Fact Sheet for Healthcare Providers: https://pope.com/  This test is not yet approved or  cleared by the [place] FDA and has been authorized for detection and/or diagnosis of SARS-CoV-2 by FDA under an Emergency Use Authorization (EUA).  This EUA will remain in effect (meaning this test can be used) for the duration of the COVID-19 declaration under Section 564(b)(1) of the Act, 21 U.S.C. section 360bbb-3(b)(1), unless the authorization is terminated or revoked sooner.  Performed at [facility], [address] [place], [place] [address]      Medications:   . enoxaparin (LOVENOX) injection  40 mg Subcutaneous Q24H   Continuous Infusions:    LOS: 0 days   Marinda Elk  [facility]  12/17/2019, 7:28 AM

## 2019-12-17 NOTE — Progress Notes (Signed)
I was called to the room stat for syncope during ECHO. When I got to the room patient was awake and oriented performed Vitals noted in flow sheet.  Pt is currently is middle of EEG Patient passed out again noted in EEG again during rapid breath test with rapid eye movement, flaccid arms, vitals obtain with no changes from previous Vitals. MD aware at the Bedside.

## 2019-12-17 NOTE — Procedures (Signed)
Patient Name: Kelly Morton  MRN: [id number]  Epilepsy Attending: Charlsie Quest  Referring Physician/Provider: Dr. Shauna Hugh Date: 12/17/1019 Duration: 25.49 mins  Patient history: 24 year old female with history of anxiety and PTSD who presented with 54 episodes where she loses the ability to keep her eyes open and stay awake although she can listen and understand what people around her are doing.  EEG to evaluate for seizures.  Level of alertness: Awake  AEDs during EEG study: None  Technical aspects: This EEG study was done with scalp electrodes positioned according to the 10-20 International system of electrode placement. Electrical activity was acquired at a sampling rate of 500Hz  and reviewed with a high frequency filter of 70Hz  and a low frequency filter of 1Hz . EEG data were recorded continuously and digitally stored.   Description: The posterior dominant rhythm consists of 10 Hz activity of moderate voltage (25-35 uV) seen predominantly in posterior head regions, symmetric and reactive to eye opening and eye closing. Physiology photic driving was not seen during photic stimulation.    One event was recorded at [phone] briefly after patient was instructed to start hyperventilation.  Patient was noted to have eye flutter followed by laying in bed with eyes closed, not responding or following any commands.  This episode lasted for about 1 minute after which patient was able to wake up and answer questions appropriately.  Concomitant EEG before during and after the episode showed normal posterior dominant rhythm.  IMPRESSION: This study is within normal limits. No seizures or epileptiform discharges were seen throughout the recording.  One episode was recorded at [phone] as described above during which patient was laying in bed with eyes closed and not responding without concomitant EEG change.  This was a nonepileptic event.   Setareh Rom 

## 2019-12-17 NOTE — Progress Notes (Addendum)
Patient had additional syncopal episode while obtaining orthostatic vital signs.  Episode occurred when patient when from lying to sitting.  Patient was able to slump back onto bed safely with help of staff remaining limp.  The episode lasted approx 15sec and VSS.  After episode, patient was able to safely stand to use BSC.  No additional episodes while standing. Siderails up with seizure pads in place.  Bed alarm on.

## 2019-12-17 NOTE — Progress Notes (Signed)
Patient arrived to the unit.  VSS.  Admitting notified of patient arrival.

## 2019-12-17 NOTE — Consult Note (Addendum)
Neurology Consultation  Reason for Consult: Multiple fainting spells Referring Physician: Dr. Cyndia Bent.  [facility]  CC: Multiple fainting spells  History is obtained from: Patient, chart  HPI: Kelly Morton is a 24 y.o. female significant past medical history for anxiety and PTSD not on any medications, occasional CBD use, migraines-being treated by [facility]. Neale [facility]ly [facility], presents for evaluation for multiple episodes of loss of consciousness/fainting spells. She describes that ever since last Sunday, she has had 54 spells of where she has lost the ability to keep her eyes open and stay awake although she can listen to and understand what people around her are doing.  She feels as if she cannot do anything at that point.  These episodes last from a few seconds to a minute or so and resolved without any intervention.  She has not had any falls or injuries with these episodes.  No whole body shaking or staring spells.  No loss of bowel or bladder function. Due to the increasing frequency with last 2 days of these episodes, she was admitted for further cardiac and neurological work-up. Denies any excessive stressors. 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 Lives with girlfriend.  No interpersonal issues with her.  No issues with safety or security.  Denies any suicidal or homicidal ideation.  Reports that she feels that her heart is racing during these episodes and eyelids become heavy, so heavy that she cannot keep them open and she "passes out" but was able to hear everything that was being sent around her.  I was able to witness 1 of these episodes while I was talking to her.  As I was going through the history and asked her question about what work she does now she had an episode with sudden closure of her eyelids, fluttering of the eyelids were closed and complete unresponsiveness to voice.  I did not  pursue any aggressive measures as her vitals and telemetry were stable.  She came about in about 30 to 40 seconds, had mildly hypophonic voice but very soon was able to converse with me normally. She has a history of migraines as a child-migraine started at the age of 12 years, used to be more retro-orbital/ocular migraines and was associated with nausea photophobia and phonophobia.  Did not have much of vomiting associated with those headaches.  Continues to have frequent migraines.  Upon discussing the plan for her management, I suggested that we will get an EEG with the hope to capture 1 of these episodes, to which she said that these episodes can sometimes be provoked by having her sit up.  I have put in my EEG order to try to capture 1 of these events if possible.  ROS: Review of systems obtained and negative except as noted in HPI.  Past Medical History:  Diagnosis Date  . Ovarian cyst   . PTSD (post-traumatic stress disorder)      Family History  Problem Relation Age of Onset  . Hypertension Mother   . Stroke Mother   . Hypertension Father     Social History:   reports that she has never smoked. She has never used smokeless tobacco. She reports current alcohol use of about 6.0 standard drinks of alcohol per week. She reports that she does not use drugs. Occasional CBD chewables as well as smoking CBD.  Medications No current facility-administered medications for this encounter.  Exam: Current vital  signs: BP 123/82 (BP Location: Left Arm)   Pulse 63   Temp 98.3 F (36.8 C) (Oral)   Resp 18   Ht 5\' 8"  (1.727 m)   SpO2 99%   BMI 28.49 kg/m  Vital signs in last 24 hours: Temp:  [98.3 F (36.8 C)-98.9 F (37.2 C)] 98.3 F (36.8 C) (08/26 0058) Pulse Rate:  [58-96] 63 (08/26 0058) Resp:  [11-21] 18 (08/26 0058) BP: (100-124)/(66-86) 123/82 (08/26 0058) SpO2:  [98 %-100 %] 99 % (08/26 0058) General awake alert in no distress HEENT: Normocephalic atraumatic Lungs:  Clear Cardiovascular: Regular rate rhythm Abdomen nondistended nontender Extremities well perfused Neurological exam Awake alert oriented x3 No aphasia No dysarthria Cranial nerves II through XII intact Motor exam: 5/5 in all fours without drift, normal tone, normal range of motion Sensory exam: Intact light touch without extinction on 77-stimulation. Coordination: Intact finger-nose-finger testing DTRs: 2+ all over Toes downgoing Gait testing deferred at this time NIH stroke scale 0  Labs I have reviewed labs in epic and the results pertinent to this consultation are:   CBC    Component Value Date/Time   WBC 8.0 12/16/2019 1824   RBC 4.44 12/16/2019 1824   HGB 14.1 12/16/2019 1824   HCT 41.7 12/16/2019 1824   PLT 293 12/16/2019 1824   MCV 93.9 12/16/2019 1824   MCH 31.8 12/16/2019 1824   MCHC 33.8 12/16/2019 1824   RDW 12.1 12/16/2019 1824   LYMPHSABS 2.5 12/16/2019 1824   MONOABS 0.6 12/16/2019 1824   EOSABS 0.1 12/16/2019 1824   BASOSABS 0.0 12/16/2019 1824    CMP     Component Value Date/Time   NA 136 12/16/2019 1824   K 3.8 12/16/2019 1824   CL 102 12/16/2019 1824   CO2 25 12/16/2019 1824   GLUCOSE 93 12/16/2019 1824   BUN 14 12/16/2019 1824   CREATININE 0.73 12/16/2019 1824   CALCIUM 8.9 12/16/2019 1824   PROT 7.4 03/21/2016 1215   ALBUMIN 4.5 03/21/2016 1215   AST 17 03/21/2016 1215   ALT 15 03/21/2016 1215   ALKPHOS 48 03/21/2016 1215   BILITOT 1.2 03/21/2016 1215   GFRNONAA >60 12/16/2019 1824   GFRAA >60 12/16/2019 1824    Lipid Panel  No results found for: CHOL, TRIG, HDL, CHOLHDL, VLDL, LDLCALC, LDLDIRECT   Imaging I have reviewed the images obtained:  CT-scan of the brain a couple of days ago without any abnormality.  Assessment:  24 year old with past medical history of anxiety, PTSD, migraines, presenting with over 50 episodes of "spells" where she loses the ability to stay awake or remain awake but is aware of what is going on  around her.  Never had falls or hurt herself during any of these episodes.  Low suspicion for seizures given description, witnessed event as well as no bowel bladder incontinence. She was seen in the ER a few days ago and ED providers recommended outpatient neurology and cardiology evaluation but the symptoms increased in frequency-she gave me a total of 54 of such episodes in the last 3 days, for which she was admitted for further work-up. I have witnessed 1 of these episodes-documented above in the HPI. My initial suspicion is that these episodes are likely psychosomatic but I would first make sure that there is no electrographic or structural abnormality in the brain that can explain this, although yield of these tests is likely low. Cardiac work-up for "syncope" should also be completed prior to characterizing her diagnosis and the psychological  realm.  Impression: Still typical episodes of inability to stay awake-less likely seizures Low suspicion for cardiogenic syncope No association with posture change-less likely to be POTS   Recommendations: -MRI brain with without contrast -Routine EEG with the hope to capture 1 of these spells on EEG-according to patient, can have spells with sitting up-instructed technologist to set her up to try to capture an event. -Cardiac work-up to include telemetry and echocardiogram per primary team. -No antiepileptics but I would recommend maintaining seizure precautions -If she were to have these episodes again, I would not use Ativan unless there is an obvious clinical seizure. -Orthostatic vital signs -UDS  -I discussed my plan in detail with the patient.  She is very cognizant of the fact that this might be a psychogenic etiology issue but I have assured her that we will work her up for any underlying CNS structural or electrographic abnormalities prior to sending her the psychological counseling way.  She is reluctant to start any medications for her  PTSD or anxiety as she feels that the side effects of those medications are bad but she is open to psychotherapy and counseling.  -I have discussed my plan in detail with the admitting hospitalist as well as the patient's RN at the bedside.  We will follow up on the studies with you.  -- Milon Dikes, MD [facility] Pager: [phone] If 7pm to 7am, please call on call as listed on [facility].

## 2019-12-17 NOTE — Progress Notes (Addendum)
EEG complete - results pending Pt skin very red after removing EEG and RN aware that some of the products we use can cause mild skin irritation .

## 2019-12-17 NOTE — Progress Notes (Signed)
Patient had an unwitnessed syncopal episode by RN but episode was witnessed by lab.  Patient was lying flat in bed during this time.  Per lab, episode lasted maybe 10 seconds.

## 2019-12-18 DIAGNOSIS — R569 Unspecified convulsions: Secondary | ICD-10-CM | POA: Diagnosis not present

## 2019-12-18 NOTE — Plan of Care (Signed)
  Problem: Education: Goal: Knowledge of General Education information will improve Description: Including pain rating scale, medication(s)/side effects and non-pharmacologic comfort measures Outcome: Adequate for Discharge   Problem: Health Behavior/Discharge Planning: Goal: Ability to manage health-related needs will improve Outcome: Adequate for Discharge   Problem: Clinical Measurements: Goal: Ability to maintain clinical measurements within normal limits will improve Outcome: Adequate for Discharge Goal: Will remain free from infection Outcome: Adequate for Discharge Goal: Diagnostic test results will improve Outcome: Adequate for Discharge Goal: Respiratory complications will improve Outcome: Adequate for Discharge Goal: Cardiovascular complication will be avoided Outcome: Adequate for Discharge   Problem: Activity: Goal: Risk for activity intolerance will decrease Outcome: Adequate for Discharge   Problem: Coping: Goal: Level of anxiety will decrease Outcome: Adequate for Discharge   Problem: Elimination: Goal: Will not experience complications related to bowel motility Outcome: Adequate for Discharge   Problem: Safety: Goal: Ability to remain free from injury will improve Outcome: Adequate for Discharge

## 2019-12-18 NOTE — Discharge Instructions (Signed)
Patient was made aware that she cannot drive, go up ladders or operate heavy machinery due to her nonorganic seizure, she needs to be at least 6 months seizure-like episode free, she will be reevaluated by psychiatry and/or psychology as an outpatient.  Per [facility] statutes, patients with seizures are not allowed to drive until they have been seizure-free for six months.    Use caution when using heavy equipment or power tools. Avoid working on ladders or at heights. Take showers instead of baths. Ensure the water temperature is not too high on the home water heater. Do not go swimming alone. Do not lock yourself in a room alone (i.e. bathroom). When caring for infants or small children, sit down when holding, feeding, or changing them to minimize risk of injury to the child in the event you have a seizure. Maintain good sleep hygiene. Avoid alcohol.    If patient has another seizure, call 911 and bring them back to the ED if: A.  The seizure lasts longer than 5 minutes.      B.  The patient doesn't wake shortly after the seizure or has new problems such as difficulty seeing, speaking or moving following the seizure C.  The patient was injured during the seizure D.  The patient has a temperature over 102 F (39C) E.  The patient vomited during the seizure and now is having trouble breathing

## 2019-12-18 NOTE — Discharge Summary (Addendum)
Physician Discharge Summary  Kelly Morton UVO:[id number] DOB: 1995-10-22 DOA: 12/16/2019  PCP: System, Pcp Not In  Admit date: 12/16/2019 Discharge date: 12/18/2019  Admitted From: Home Disposition:  Home  Recommendations for Outpatient Follow-up:  1. Follow up with PCP in 1-2 weeks, recheck TSH and free T4 as an outpatient. 2. Please obtain BMP/CBC in one week 3. 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  Home Health:No Equipment/Devices:None  Discharge Condition:stable CODE STATUS:Full Diet recommendation: Heart Healthy   Brief/Interim Summary: 24 y.o. female past medical history of headache anxiety posttraumatic stress disorder comes into the ED from med [facility] due to numerous seizure-like activities over the last several days.  Discharge Diagnoses:  Principal Problem:   Seizure-like activity (HCC)  Consulted they recommended a 2D echo that showed no wall motion abnormality preserved EF, some mitral valve prolapse, MRI and EEG showed no acute findings. She had no events on telemetry. UDS was also done for marijuana. She had multiple seizure events that were witnessed by me and neurology in the hospital, the they were not tonic-clonic no loss of control sphincters, no biting of her tongue she will wake up when she was in the be not postictal.  Discharge Instructions  Discharge Instructions    Diet - low sodium heart healthy   Complete by: As directed    Increase activity slowly   Complete by: As directed      Allergies as of 12/18/2019      Reactions   Bactrim [sulfamethoxazole-trimethoprim] Hives   Sulfur Hives      Medication List    TAKE these medications   acetaminophen 325 MG tablet Commonly known as: TYLENOL Take 650 mg by mouth every 6 (six) hours as needed for mild pain.   aspirin-acetaminophen-caffeine 250-250-65 MG tablet Commonly known as: EXCEDRIN MIGRAINE Take 3 tablets by mouth every 6 (six) hours as needed for  headache or migraine.   ibuprofen 600 MG tablet Commonly known as: ADVIL Take 600 mg by mouth every 6 (six) hours as needed for moderate pain or cramping.   Nexplanon 68 MG Impl implant Generic drug: etonogestrel 1 each by Subdermal route once.       Allergies  Allergen Reactions  . Bactrim [Sulfamethoxazole-Trimethoprim] Hives  . Sulfur Hives    Consultations:  Neurology   Procedures/Studies: EEG  Result Date: 12/17/2019 Charlsie Quest, MD     12/17/2019 10:07 AM Patient Name: Kelly Morton MRN: [id number] Epilepsy Attending: Charlsie Quest Referring Physician/Provider: Dr. Shauna Hugh Date: 12/17/1019 Duration: 25.49 mins Patient history: 24 year old female with history of anxiety and PTSD who presented with 54 episodes where she loses the ability to keep her eyes open and stay awake although she can listen and understand what people around her are doing.  EEG to evaluate for seizures. Level of alertness: Awake AEDs during EEG study: None Technical aspects: This EEG study was done with scalp electrodes positioned according to the 10-20 International system of electrode placement. Electrical activity was acquired at a sampling rate of 500Hz  and reviewed with a high frequency filter of 70Hz  and a low frequency filter of 1Hz . EEG data were recorded continuously and digitally stored. Description: The posterior dominant rhythm consists of 10 Hz activity of moderate voltage (25-35 uV) seen predominantly in posterior head regions, symmetric and reactive to eye opening and eye closing. Physiology photic driving was not seen during photic stimulation.  One event was recorded at [phone] briefly after patient was instructed to start hyperventilation.  Patient was noted to have eye flutter followed by laying in bed with eyes closed, not responding or following any commands.  This episode lasted for about 1 minute after which patient was able to wake up and answer questions appropriately.   Concomitant EEG before during and after the episode showed normal posterior dominant rhythm. IMPRESSION: This study is within normal limits. No seizures or epileptiform discharges were seen throughout the recording. One episode was recorded at [phone] as described above during which patient was laying in bed with eyes closed and not responding without concomitant EEG change.  This was a nonepileptic event. Charlsie Quest   CT Head Wo Contrast  Result Date: 12/14/2019 CLINICAL DATA:  Syncope EXAM: CT HEAD WITHOUT CONTRAST TECHNIQUE: Contiguous axial images were obtained from the base of the skull through the vertex without intravenous contrast. COMPARISON:  06/08/2014 FINDINGS: Brain: No acute intracranial abnormality. Specifically, no hemorrhage, hydrocephalus, mass lesion, acute infarction, or significant intracranial injury. Vascular: No hyperdense vessel or unexpected calcification. Skull: No acute calvarial abnormality. Sinuses/Orbits: Visualized paranasal sinuses and mastoids clear. Orbital soft tissues unremarkable. Other: None IMPRESSION: Normal study. Electronically Signed   By: Charlett Nose M.D.   On: 12/14/2019 19:03   MR BRAIN W WO CONTRAST  Result Date: 12/17/2019 CLINICAL DATA:  Simple syncope with normal neuro exam. Syncope during orthostatics assessment. EXAM: MRI HEAD WITHOUT AND WITH CONTRAST TECHNIQUE: Multiplanar, multiecho pulse sequences of the brain and surrounding structures were obtained without and with intravenous contrast. CONTRAST:  14mL GADAVIST GADOBUTROL 1 MMOL/ML IV SOLN COMPARISON:  Head CT from 3 days ago FINDINGS: Brain: No infarction, hemorrhage, hydrocephalus, extra-axial collection or mass lesion. No white matter disease or atrophy. No abnormal intracranial enhancement. Vascular: Normal flow voids and vascular enhancements Skull and upper cervical spine: Normal marrow signal Sinuses/Orbits: Negative IMPRESSION: Normal brain MRI. Electronically Signed   By: Marnee Spring  M.D.   On: 12/17/2019 07:02   ECHOCARDIOGRAM COMPLETE  Result Date: 12/17/2019    ECHOCARDIOGRAM REPORT   Patient Name:   Kelly Morton Date of Exam: 12/17/2019 Medical Rec #:  [id number]         Height:       68.0 in Accession #:    [id number]        Weight:       186.6 lb Date of Birth:  1995/10/09          BSA:          1.984 m Patient Age:    24 years          BP:           117/75 mmHg Patient Gender: F                 HR:           86 bpm. Exam Location:  Inpatient Procedure: 2D Echo and Strain Analysis Indications:    Syncope 780.2 / R55  History:        Patient has no prior history of Echocardiogram examinations.                 Patient presenting with dozens of episodes of seizure-like                 activity in the past 4 days.  Sonographer:    Leta Jungling RDCS Referring Phys: [id number] Deno Lunger [facility] IMPRESSIONS  1. Mild bileaflet mitral valve prolapse with mild mitral annular disjunction. Displacement distance 6 mm. The  mitral valve is myxomatous. Mild mitral valve regurgitation. No evidence of mitral stenosis.  2. Left ventricular ejection fraction, by estimation, is 60 to 65%. The left ventricle has normal function. The left ventricle has no regional wall motion abnormalities. Left ventricular diastolic parameters were normal. The average left ventricular global longitudinal strain is -20.4 %. The global longitudinal strain is normal.  3. Right ventricular systolic function is normal. The right ventricular size is normal.  4. The aortic valve is tricuspid. Aortic valve regurgitation is not visualized. No aortic stenosis is present. FINDINGS  Left Ventricle: Left ventricular ejection fraction, by estimation, is 60 to 65%. The left ventricle has normal function. The left ventricle has no regional wall motion abnormalities. The average left ventricular global longitudinal strain is -20.4 %. The global longitudinal strain is normal. The left ventricular internal cavity size was normal in size.  There is no left ventricular hypertrophy. Left ventricular diastolic parameters were normal. Right Ventricle: The right ventricular size is normal. No increase in right ventricular wall thickness. Right ventricular systolic function is normal. Left Atrium: Left atrial size was normal in size. Right Atrium: Right atrial size was normal in size. Pericardium: There is no evidence of pericardial effusion. Mitral Valve: Mild bileaflet mitral valve prolapse with mild mitral annular disjunction. Displacement distance 6 mm. The mitral valve is myxomatous. Mild mitral valve regurgitation. No evidence of mitral valve stenosis. Tricuspid Valve: The tricuspid valve is normal in structure. Tricuspid valve regurgitation is trivial. No evidence of tricuspid stenosis. Aortic Valve: The aortic valve is tricuspid. Aortic valve regurgitation is not visualized. No aortic stenosis is present. Pulmonic Valve: The pulmonic valve was normal in structure. Pulmonic valve regurgitation is trivial. No evidence of pulmonic stenosis. Aorta: The aortic root is normal in size and structure. Venous: The inferior vena cava was not well visualized. IAS/Shunts: The interatrial septum was not well visualized.  LEFT VENTRICLE PLAX 2D LVIDd:         4.40 cm  Diastology LVIDs:         2.60 cm  LV e' lateral:   12.40 cm/s LV PW:         0.80 cm  LV E/e' lateral: 4.0 LV IVS:        0.80 cm  LV e' medial:    10.60 cm/s LVOT diam:     1.90 cm  LV E/e' medial:  4.7 LV SV:         39 LV SV Index:   20       2D Longitudinal Strain LVOT Area:     2.84 cm 2D Strain GLS Avg:     -20.4 %  RIGHT VENTRICLE RV S prime:     17.40 cm/s LEFT ATRIUM             Index       RIGHT ATRIUM          Index LA diam:        3.30 cm 1.66 cm/m  RA Area:     8.88 cm LA Vol (A2C):   29.6 ml 14.92 ml/m RA Volume:   13.10 ml 6.60 ml/m LA Vol (A4C):   22.4 ml 11.29 ml/m LA Biplane Vol: 26.8 ml 13.51 ml/m  AORTIC VALVE LVOT Vmax:   82.20 cm/s LVOT Vmean:  56.200 cm/s LVOT VTI:     0.138 m  AORTA Ao Asc diam: 2.70 cm MITRAL VALVE MV Area (PHT): 3.03 cm    SHUNTS MV Decel Time: 250 msec  Systemic VTI:  0.14 m MV E velocity: 49.30 cm/s  Systemic Diam: 1.90 cm MV A velocity: 49.90 cm/s MV E/A ratio:  0.99 Weston Brass MD Electronically signed by Weston Brass MD Signature Date/Time: 12/17/2019/12:07:34 PM    Final     (Echo, Carotid, EGD, Colonoscopy, ERCP)    Subjective: No complaints.  Discharge Exam: Vitals:   12/17/19 2322 12/18/19 0448  BP: 113/85 (!) 119/94  Pulse: 99 89  Resp: 18 18  Temp: 98.1 F (36.7 C) 98.2 F (36.8 C)  SpO2: 100% 100%   Vitals:   12/17/19 1223 12/17/19 1959 12/17/19 2322 12/18/19 0448  BP:  120/86 113/85 (!) 119/94  Pulse:  89 99 89  Resp:  18 18 18   Temp: 99.4 F (37.4 C) 98.1 F (36.7 C) 98.1 F (36.7 C) 98.2 F (36.8 C)  TempSrc: Oral Oral Oral Oral  SpO2:  100% 100% 100%  Weight:    86.6 kg  Height:        General: Pt is alert, awake, not in acute distress Cardiovascular: RRR, S1/S2 +, no rubs, no gallops Respiratory: CTA bilaterally, no wheezing, no rhonchi Abdominal: Soft, NT, ND, bowel sounds + Extremities: no edema, no cyanosis    The results of significant diagnostics from this hospitalization (including imaging, microbiology, ancillary and laboratory) are listed below for reference.     Microbiology: Recent Results (from the past 240 hour(s))  Novel Coronavirus, NAA ([facility])     Status: None   Collection Time: 12/08/19  3:07 PM   Specimen: Nasopharyngeal(NP) swabs in vial transport medium   Nasopharynge  Screenin  Result Value Ref Range Status   SARS-CoV-2, NAA Not Detected Not Detected Final    Comment: This nucleic acid amplification test was developed and its performance characteristics determined by 12/10/19. Nucleic acid amplification tests include RT-PCR and TMA. This test has not been FDA cleared or approved. This test has been authorized by FDA under an Emergency Use  Authorization (EUA). This test is only authorized for the duration of time the declaration that circumstances exist justifying the authorization of the emergency use of in vitro diagnostic tests for detection of SARS-CoV-2 virus and/or diagnosis of COVID-19 infection under section 564(b)(1) of the Act, 21 U.S.C. [employer]) (1), unless the authorization is terminated or revoked sooner. When diagnostic testing is negative, the possibility of a false negative result should be considered in the context of a patient's recent exposures and the presence of clinical signs and symptoms consistent with COVID-19. An individual without symptoms of COVID-19 and who is not shedding SARS-CoV-2 virus wo uld expect to have a negative (not detected) result in this assay.   SARS-COV-2, NAA 2 DAY TAT     Status: None   Collection Time: 12/08/19  3:07 PM   Nasopharynge  Screenin  Result Value Ref Range Status   SARS-CoV-2, NAA 2 DAY TAT Performed  Final  SARS Coronavirus 2 by RT PCR (hospital order, performed in [facility] hospital lab) Nasopharyngeal Nasopharyngeal Swab     Status: None   Collection Time: 12/14/19 11:07 PM   Specimen: Nasopharyngeal Swab  Result Value Ref Range Status   SARS Coronavirus 2 NEGATIVE NEGATIVE Final    Comment: (NOTE) SARS-CoV-2 target nucleic acids are NOT DETECTED.  The SARS-CoV-2 RNA is generally detectable in upper and lower respiratory specimens during the acute phase of infection. The lowest concentration of SARS-CoV-2 viral copies this assay can detect is 250 copies / mL. A negative result does not preclude  SARS-CoV-2 infection and should not be used as the sole basis for treatment or other patient management decisions.  A negative result may occur with improper specimen collection / handling, submission of specimen other than nasopharyngeal swab, presence of viral mutation(s) within the areas targeted by this assay, and inadequate number of viral copies (<250  copies / mL). A negative result must be combined with clinical observations, patient history, and epidemiological information.  Fact Sheet for Patients:   BoilerBrush.com.cy  Fact Sheet for Healthcare Providers: https://pope.com/  This test is not yet approved or  cleared by the [place] FDA and has been authorized for detection and/or diagnosis of SARS-CoV-2 by FDA under an Emergency Use Authorization (EUA).  This EUA will remain in effect (meaning this test can be used) for the duration of the COVID-19 declaration under Section 564(b)(1) of the Act, 21 U.S.C. section 360bbb-3(b)(1), unless the authorization is terminated or revoked sooner.  Performed at [facility], [address], [place], [place] [address]   SARS Coronavirus 2 by RT PCR (hospital order, performed in [facility] hospital lab) Nasopharyngeal Nasopharyngeal Swab     Status: None   Collection Time: 12/16/19  6:24 PM   Specimen: Nasopharyngeal Swab  Result Value Ref Range Status   SARS Coronavirus 2 NEGATIVE NEGATIVE Final    Comment: (NOTE) SARS-CoV-2 target nucleic acids are NOT DETECTED.  The SARS-CoV-2 RNA is generally detectable in upper and lower respiratory specimens during the acute phase of infection. The lowest concentration of SARS-CoV-2 viral copies this assay can detect is 250 copies / mL. A negative result does not preclude SARS-CoV-2 infection and should not be used as the sole basis for treatment or other patient management decisions.  A negative result may occur with improper specimen collection / handling, submission of specimen other than nasopharyngeal swab, presence of viral mutation(s) within the areas targeted by this assay, and inadequate number of viral copies (<250 copies / mL). A negative result must be combined with clinical observations, patient history, and epidemiological information.  Fact Sheet for Patients:    BoilerBrush.com.cy  Fact Sheet for Healthcare Providers: https://pope.com/  This test is not yet approved or  cleared by the [place] FDA and has been authorized for detection and/or diagnosis of SARS-CoV-2 by FDA under an Emergency Use Authorization (EUA).  This EUA will remain in effect (meaning this test can be used) for the duration of the COVID-19 declaration under Section 564(b)(1) of the Act, 21 U.S.C. section 360bbb-3(b)(1), unless the authorization is terminated or revoked sooner.  Performed at [facility], [address] [place], [place] [address]      Labs: BNP (last 3 results) No results for input(s): BNP in the last 8760 hours. Basic Metabolic Panel: Recent Labs  Lab 12/14/19 0929 12/16/19 1824 12/17/19 0459  NA 137 136 137  K 3.9 3.8 4.0  CL 107 102 103  CO2 GLUCOSE 102* 93 100*  BUN CREATININE 0.63 0.73 0.73  CALCIUM 8.6* 8.9 8.9  MG  --   --  2.2   Liver Function Tests: Recent Labs  Lab 12/17/19 0459  AST 13*  ALT 12  ALKPHOS 43  BILITOT 0.7  PROT 6.9  ALBUMIN 3.9   No results for input(s): LIPASE, AMYLASE in the last 168 hours. No results for input(s): AMMONIA in the last 168 hours. CBC: Recent Labs  Lab 12/14/19 0929 12/16/19 1824 12/17/19 0459  WBC 5.1  8.0 6.7  NEUTROABS  --  4.8 3.3  HGB 13.5 14.1 13.7  HCT 40.1 41.7 42.0  MCV 94.8 93.9 94.8  PLT 256 293 247   Cardiac Enzymes: No results for input(s): CKTOTAL, CKMB, CKMBINDEX, TROPONINI in the last 168 hours. BNP: Invalid input(s): POCBNP CBG: Recent Labs  Lab 12/14/19 0914  GLUCAP 107*   D-Dimer No results for input(s): DDIMER in the last 72 hours. Hgb A1c No results for input(s): HGBA1C in the last 72 hours. Lipid Profile No results for input(s): CHOL, HDL, LDLCALC, TRIG, CHOLHDL, LDLDIRECT in the last 72 hours. Thyroid function studies Recent Labs    12/17/19 0459  TSH 5.819*    Anemia work up No results for input(s): VITAMINB12, FOLATE, FERRITIN, TIBC, IRON, RETICCTPCT in the last 72 hours. Urinalysis    Component Value Date/Time   COLORURINE YELLOW 12/14/2019 1821   APPEARANCEUR CLEAR 12/14/2019 1821   LABSPEC 1.017 12/14/2019 1821   PHURINE 6.0 12/14/2019 1821   GLUCOSEU NEGATIVE 12/14/2019 1821   HGBUR NEGATIVE 12/14/2019 1821   BILIRUBINUR NEGATIVE 12/14/2019 1821   KETONESUR NEGATIVE 12/14/2019 1821   PROTEINUR NEGATIVE 12/14/2019 1821   NITRITE NEGATIVE 12/14/2019 1821   LEUKOCYTESUR NEGATIVE 12/14/2019 1821   Sepsis Labs Invalid input(s): PROCALCITONIN,  WBC,  LACTICIDVEN Microbiology Recent Results (from the past 240 hour(s))  Novel Coronavirus, NAA ([facility])     Status: None   Collection Time: 12/08/19  3:07 PM   Specimen: Nasopharyngeal(NP) swabs in vial transport medium   Nasopharynge  Screenin  Result Value Ref Range Status   SARS-CoV-2, NAA Not Detected Not Detected Final    Comment: This nucleic acid amplification test was developed and its performance characteristics determined by [employer]. Nucleic acid amplification tests include RT-PCR and TMA. This test has not been FDA cleared or approved. This test has been authorized by FDA under an Emergency Use Authorization (EUA). This test is only authorized for the duration of time the declaration that circumstances exist justifying the authorization of the emergency use of in vitro diagnostic tests for detection of SARS-CoV-2 virus and/or diagnosis of COVID-19 infection under section 564(b)(1) of the Act, 21 U.S.C. 161WRU-0(A) (1), unless the authorization is terminated or revoked sooner. When diagnostic testing is negative, the possibility of a false negative result should be considered in the context of a patient's recent exposures and the presence of clinical signs and symptoms consistent with COVID-19. An individual without symptoms of COVID-19 and who is not shedding  SARS-CoV-2 virus wo uld expect to have a negative (not detected) result in this assay.   SARS-COV-2, NAA 2 DAY TAT     Status: None   Collection Time: 12/08/19  3:07 PM   Nasopharynge  Screenin  Result Value Ref Range Status   SARS-CoV-2, NAA 2 DAY TAT Performed  Final  SARS Coronavirus 2 by RT PCR (hospital order, performed in [facility] hospital lab) Nasopharyngeal Nasopharyngeal Swab     Status: None   Collection Time: 12/14/19 11:07 PM   Specimen: Nasopharyngeal Swab  Result Value Ref Range Status   SARS Coronavirus 2 NEGATIVE NEGATIVE Final    Comment: (NOTE) SARS-CoV-2 target nucleic acids are NOT DETECTED.  The SARS-CoV-2 RNA is generally detectable in upper and lower respiratory specimens during the acute phase of infection. The lowest concentration of SARS-CoV-2 viral copies this assay can detect is 250 copies / mL. A negative result does not preclude SARS-CoV-2 infection and should not be used as the sole basis for treatment or  other patient management decisions.  A negative result may occur with improper specimen collection / handling, submission of specimen other than nasopharyngeal swab, presence of viral mutation(s) within the areas targeted by this assay, and inadequate number of viral copies (<250 copies / mL). A negative result must be combined with clinical observations, patient history, and epidemiological information.  Fact Sheet for Patients:   BoilerBrush.com.cy  Fact Sheet for Healthcare Providers: https://pope.com/  This test is not yet approved or  cleared by the [place] FDA and has been authorized for detection and/or diagnosis of SARS-CoV-2 by FDA under an Emergency Use Authorization (EUA).  This EUA will remain in effect (meaning this test can be used) for the duration of the COVID-19 declaration under Section 564(b)(1) of the Act, 21 U.S.C. section 360bbb-3(b)(1), unless the authorization is  terminated or revoked sooner.  Performed at [facility], [address], [place], [place] [address]   SARS Coronavirus 2 by RT PCR (hospital order, performed in [facility] hospital lab) Nasopharyngeal Nasopharyngeal Swab     Status: None   Collection Time: 12/16/19  6:24 PM   Specimen: Nasopharyngeal Swab  Result Value Ref Range Status   SARS Coronavirus 2 NEGATIVE NEGATIVE Final    Comment: (NOTE) SARS-CoV-2 target nucleic acids are NOT DETECTED.  The SARS-CoV-2 RNA is generally detectable in upper and lower respiratory specimens during the acute phase of infection. The lowest concentration of SARS-CoV-2 viral copies this assay can detect is 250 copies / mL. A negative result does not preclude SARS-CoV-2 infection and should not be used as the sole basis for treatment or other patient management decisions.  A negative result may occur with improper specimen collection / handling, submission of specimen other than nasopharyngeal swab, presence of viral mutation(s) within the areas targeted by this assay, and inadequate number of viral copies (<250 copies / mL). A negative result must be combined with clinical observations, patient history, and epidemiological information.  Fact Sheet for Patients:   BoilerBrush.com.cy  Fact Sheet for Healthcare Providers: https://pope.com/  This test is not yet approved or  cleared by the [place] FDA and has been authorized for detection and/or diagnosis of SARS-CoV-2 by FDA under an Emergency Use Authorization (EUA).  This EUA will remain in effect (meaning this test can be used) for the duration of the COVID-19 declaration under Section 564(b)(1) of the Act, 21 U.S.C. section 360bbb-3(b)(1), unless the authorization is terminated or revoked sooner.  Performed at [facility], [address] [place], [place] [address]      Time coordinating discharge:  Over 40 minutes  SIGNED:   Marinda Elk, MD  [facility] 12/18/2019, 7:42 AM Pager   If 7PM-7AM, please contact night-coverage [email or web address] Password [email or web address]

## 2019-12-18 NOTE — Progress Notes (Signed)
D/C instructions given and reviewed. No questions asked but encourage to call with any concerns. Tele and IV removed. Tolerated well. Attempting to find a ride home at present.

## 2019-12-18 NOTE — Plan of Care (Signed)
  Problem: Nutrition: Goal: Adequate nutrition will be maintained Outcome: Completed/Met   Problem: Elimination: Goal: Will not experience complications related to urinary retention Outcome: Completed/Met   Problem: Pain Managment: Goal: General experience of comfort will improve Outcome: Completed/Met   Problem: Skin Integrity: Goal: Risk for impaired skin integrity will decrease Outcome: Completed/Met   

## 2019-12-19 ENCOUNTER — Ambulatory Visit (INDEPENDENT_AMBULATORY_CARE_PROVIDER_SITE_OTHER): Payer: 59

## 2019-12-19 DIAGNOSIS — R55 Syncope and collapse: Secondary | ICD-10-CM | POA: Diagnosis not present

## 2020-01-01 ENCOUNTER — Ambulatory Visit: Payer: 59 | Admitting: Internal Medicine

## 2020-01-01 ENCOUNTER — Other Ambulatory Visit: Payer: Self-pay

## 2020-01-01 ENCOUNTER — Encounter: Payer: Self-pay | Admitting: Internal Medicine

## 2020-01-01 VITALS — BP 112/78 | HR 81 | Ht 68.0 in | Wt 189.0 lb

## 2020-01-01 DIAGNOSIS — R55 Syncope and collapse: Secondary | ICD-10-CM

## 2020-01-01 DIAGNOSIS — I341 Nonrheumatic mitral (valve) prolapse: Secondary | ICD-10-CM | POA: Diagnosis not present

## 2020-01-01 NOTE — Patient Instructions (Signed)
Medication Instructions:  Your physician recommends that you continue on your current medications as directed. Please refer to the Current Medication list given to you today.  *If you need a refill on your cardiac medications before your next appointment, please call your pharmacy*   Lab Work: None  If you have labs (blood work) drawn today and your tests are completely normal, you will receive your results only by: Marland Kitchen MyChart Message (if you have MyChart) OR . A paper copy in the mail If you have any lab test that is abnormal or we need to change your treatment, we will call you to review the results.   Testing/Procedures: None   Follow-Up: Follow up with Dr. Izora Ribas on 07/01/20 at 8:40 AM   Other Instructions Let us know if your symptoms change or worsen

## 2020-01-01 NOTE — Progress Notes (Signed)
.  Cardiology Office Note:    Date:  01/01/2020   ID:  Kelly Morton, DOB 09-Sep-1995, MRN [id number]  PCP:  System, Pcp Not In  [facility] Cardiologist:  No primary care provider on file.  [facility] Electrophysiologist:  None   Referring MD: Robinson, [place] N, PA-C   No chief complaint on file. Seen in consultation for the evaluation of syncope at the behest of [place] Robinson, [place].  History of Present Illness:    Kelly Morton is a 24 y.o. female with a hx of syncope, PTSD, chronic migraine, and hx of IDA.  Presyncopal prodrome for the IDA related event.  Panic Attacks feels like she can't breath.  First episode 12/13/19.  Was at [facility], and felt like she was going to pass out.  Passed out, landed on her partner. Could hear what was going on but could not respond.  Syncope lasts ~ 2 minutes.  30s to 1 min.  Afterwards and feels hungover but knows where she is. No chest pain.  No baseline SOB.  Reviewed symptom log:  Pounding heart rate.   Passed out during 12/21/19- sinus 12/22/29 1015- near syncope  Past Medical History:  Diagnosis Date  . Ovarian cyst   . PTSD (post-traumatic stress disorder)   . Syncope     Past Surgical History:  Procedure Laterality Date  . OOPHORECTOMY    . TONSILLECTOMY      Current Medications: No outpatient medications have been marked as taking for the 01/01/20 encounter (Office Visit) with Christell Constant, MD.     Allergies:   Fire ant, Bactrim [sulfamethoxazole-trimethoprim], and Sulfur   Social History   Socioeconomic History  . Marital status: Single    Spouse name: Not on file  . Number of children: Not on file  . Years of education: Not on file  . Highest education level: Not on file  Occupational History  . Not on file  Tobacco Use  . Smoking status: Never Smoker  . Smokeless tobacco: Never Used  Substance and Sexual Activity  . Alcohol use: Yes    Alcohol/week: 6.0 standard drinks    Types: 6 Glasses  of wine per week  . Drug use: Never  . Sexual activity: Not on file  Other Topics Concern  . Not on file  Social History Narrative  . Not on file   Social Determinants of Health   Financial Resource Strain:   . Difficulty of Paying Living Expenses: Not on file  Food Insecurity:   . Worried About Programme researcher, broadcasting/film/video in the Last Year: Not on file  . Ran Out of Food in the Last Year: Not on file  Transportation Needs:   . Lack of Transportation (Medical): Not on file  . Lack of Transportation (Non-Medical): Not on file  Physical Activity:   . Days of Exercise per Week: Not on file  . Minutes of Exercise per Session: Not on file  Stress:   . Feeling of Stress : Not on file  Social Connections:   . Frequency of Communication with Friends and Family: Not on file  . Frequency of Social Gatherings with Friends and Family: Not on file  . Attends Religious Services: Not on file  . Active Member of Clubs or Organizations: Not on file  . Attends Banker Meetings: Not on file  . Marital Status: Not on file   Family History: The patient's family history includes Hypertension in her father and mother; Stroke in  her mother.  Grandmother has arrhythmia lived to 41; no SCD, drowning's car accidents.  ROS:   Please see the history of present illness.    All other systems reviewed and are negative.  EKGs/Labs/Other Studies Reviewed:    The following studies were reviewed today:  Recent Labs: 12/17/2019: ALT 12; BUN 11; Creatinine, Ser 0.73; Hemoglobin 13.7; Magnesium 2.2; Platelets 247; Potassium 4.0; Sodium 137; TSH 5.819  Recent Lipid Panel No results found for: CHOL, TRIG, HDL, CHOLHDL, VLDL, LDLCALC, LDLDIRECT   ZioPatch:  Not finalized:  Triggers on 8/28, 8/29/ 8/30.  All sinus rhythm  Echo- subtle PLAX evidence of Mitral Annular Disjunction, disjunction distance < 10 mm; personally reviewed.  Per read 1. Mild bileaflet mitral valve prolapse with mild mitral annular   disjunction. Displacement distance 6 mm. The mitral valve is myxomatous.  Mild mitral valve regurgitation. No evidence of mitral stenosis.  2. Left ventricular ejection fraction, by estimation, is 60 to 65%. The  left ventricle has normal function. The left ventricle has no regional  wall motion abnormalities. Left ventricular diastolic parameters were  normal. The average left ventricular  global longitudinal strain is -20.4 %. The global longitudinal strain is  normal.  3. Right ventricular systolic function is normal. The right ventricular  size is normal.  4. The aortic valve is tricuspid. Aortic valve regurgitation is not  visualized. No aortic stenosis is present.   Physical Exam:    VS:  BP 112/78   Pulse 81   Ht 5\' 8"  (1.727 m)   Wt 189 lb (85.7 kg)   SpO2 99%   BMI 28.74 kg/m     Wt Readings from Last 3 Encounters:  01/01/20 189 lb (85.7 kg)  12/18/19 190 lb 14.7 oz (86.6 kg)  12/14/19 187 lb 6.3 oz (85 kg)     GEN:  Well nourished, well developed in no acute distress HEENT: Normal NECK: No JVD; No carotid bruits LYMPHATICS: No lymphadenopathy CARDIAC: RRR, no murmurs, rubs, gallops RESPIRATORY:  Clear to auscultation without rales, wheezing or rhonchi  ABDOMEN: Soft, non-tender, non-distended MUSCULOSKELETAL:  No edema; No deformity  SKIN: Warm and dry bilateral wrist tattoos (sun) NEUROLOGIC:  Alert and oriented x 3 PSYCHIATRIC:  Normal affect   ASSESSMENT:    1. Syncope and collapse   2. Mitral valve prolapse    PLAN:    In order of problems listed above:  Syncope  Mitral Annular Dysjunction MAD distance < 14 mm Mild mitral valve prolapse Anxiety and PTSD - no evidence of PVC/VT and syncope is not described as cardiac in nature; patient has had events while being monitored - SHARED DECISION MAKING:  Discussed the risks and benefits of CMR without significant sedation for evaluation of MAD.  Will see patient patient does with therapy and continue  monitoring.  If worsening event or patient changes her perspective, will get CMR.  Otherwise will monitor  6 months f/u.   Medication Adjustments/Labs and Tests Ordered: Current medicines are reviewed at length with the patient today.  Concerns regarding medicines are outlined above.  No orders of the defined types were placed in this encounter.  No orders of the defined types were placed in this encounter.   There are no Patient Instructions on file for this visit.   Signed, 12/16/19, MD  01/01/2020 11:52 AM    [facility]

## 2020-01-12 ENCOUNTER — Other Ambulatory Visit: Payer: 59

## 2020-01-12 DIAGNOSIS — Z20822 Contact with and (suspected) exposure to covid-19: Secondary | ICD-10-CM

## 2020-01-15 LAB — NOVEL CORONAVIRUS, NAA

## 2020-02-02 ENCOUNTER — Ambulatory Visit (HOSPITAL_COMMUNITY)
Admission: EM | Admit: 2020-02-02 | Discharge: 2020-02-02 | Disposition: A | Discharge status: Home / Self Care | Payer: 59

## 2020-02-02 ENCOUNTER — Other Ambulatory Visit: Payer: Self-pay

## 2020-03-05 ENCOUNTER — Ambulatory Visit: Payer: Medicaid Other | Attending: Internal Medicine

## 2020-03-05 DIAGNOSIS — Z23 Encounter for immunization: Secondary | ICD-10-CM

## 2020-03-05 NOTE — Progress Notes (Signed)
   Covid-19 Vaccination Clinic  Name:  Kelly Morton    MRN: [id number] DOB: Jan 04, 1996  03/05/2020  [name] was observed post Covid-19 immunization for 15 minutes without incident. She was provided with Vaccine Information Sheet and instruction to access the V-Safe system.   [name] was instructed to call 911 with any severe reactions post vaccine: Marland Kitchen Difficulty breathing  . Swelling of face and throat  . A fast heartbeat  . A bad rash all over body  . Dizziness and weakness   Immunizations Administered    Name Date Dose VIS Date Route   Pfizer COVID-19 Vaccine 03/05/2020  5:27 PM 0.3 mL 02/10/2020 Intramuscular   Manufacturer: [employer], [employer]   Lot: [id number]   NDC: [address]-0

## 2020-03-06 NOTE — Progress Notes (Addendum)
° °  Subjective:   Patient ID: Kelly Morton, female    DOB: [birth year], 24 y.o.   MRN: [id number]  CC: Establish care  HPI:  Kelly Morton is a very pleasant 24 y.o. female who presents today to establish care.  Initial concerns: Hx of psychogenic seizures would like to know when she will be cleared to drive.   Past medical history:  Ovarian cyst Psychogenic seizure Syncope PTSD Depression/Anxiety   Past surgical history: 2015 Rt. 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 normal heart sounds, no murmurs. Respiratory: CTAB, normal effort, No wheezes, rales or rhonchi Abdomen: Bowel sounds present, non-tender, non-distended, no hepatosplenomegaly Skin: warm and dry, no rashes noted Psych: Normal affect and mood  Assessment & Plan:   Encounter to establish care Healthy appearing 24 yo F.  -UTD w/ pap (need records) and immunizations (Flu, COVID x2 and booster) -Sees therapist 2x wk -Psychiatry every 2 months -Counseled on STDs even with same sex partner -Briefly discussed CBD/Delta 8 may not be beneficial with hx of seizure and unclear the ultimate side affects  Seizure-like activity (HCC) Given and discussed precautions below.   SEIZURE CAUTIONS and WARNINGS --Per [facility] statutes, patients with seizures are not  allowed to drive until  they have been seizure-free for six months.   - They are to use caution when using heavy equipment or power tools.  - They are to avoid working on ladders or at heights.  - They are to take showers instead of baths.  - They are to ensure the water temperature is not too high on the home water heater.  - They are not to go swimming alone.   - When caring for infants or small children, patients with seizures are to sit down when holding, feeding, or changing them to minimize risk of injury to the child in the event having a seizure.   [facility], DO [facility] PGY-2

## 2020-03-07 ENCOUNTER — Encounter: Payer: Self-pay | Admitting: Family Medicine

## 2020-03-07 ENCOUNTER — Other Ambulatory Visit: Payer: Self-pay

## 2020-03-07 ENCOUNTER — Ambulatory Visit (INDEPENDENT_AMBULATORY_CARE_PROVIDER_SITE_OTHER): Payer: Self-pay | Admitting: Family Medicine

## 2020-03-07 VITALS — BP 102/62 | HR 108 | Ht 68.0 in | Wt 193.4 lb

## 2020-03-07 DIAGNOSIS — Z7689 Persons encountering health services in other specified circumstances: Secondary | ICD-10-CM

## 2020-03-07 DIAGNOSIS — R569 Unspecified convulsions: Secondary | ICD-10-CM

## 2020-03-07 NOTE — Patient Instructions (Signed)
It was wonderful to meet you today. Thank you for choosing [place] as your health care providers.   Today we talked about:  When you will be able to drive following your seizure like activity. Read the precautions below.   Continue to follow up with your psychiatrist and psychologist.   Please call the clinic at [phone] if you have any concerns. It was our pleasure to serve you.  Dr. Salvadore Dom   SEIZURE CAUTIONS and WARNINGS --Per [facility] statutes, patients with seizures are not allowed to drive until  they have been seizure-free for six months.   - They are to use caution when using heavy equipment or power tools.  - They are to avoid working on ladders or at heights.  - They are to take showers instead of baths.  - They are to ensure the water temperature is not too high on the home water heater.  - They are not to go swimming alone.   - When caring for infants or small children, patients with seizures are to sit down when holding, feeding, or changing them to minimize risk of injury to the child in the event having a seizure.

## 2020-03-10 ENCOUNTER — Encounter: Payer: Self-pay | Admitting: Family Medicine

## 2020-03-10 DIAGNOSIS — Z7689 Persons encountering health services in other specified circumstances: Secondary | ICD-10-CM | POA: Insufficient documentation

## 2020-03-10 NOTE — Assessment & Plan Note (Addendum)
Healthy appearing 24 yo F.  -UTD w/ pap (need records) and immunizations (Flu, COVID x2 and booster) -Sees therapist 2x wk -Psychiatry every 2 months -Counseled on STDs even with same sex partner -Briefly discussed CBD/Delta 8 may not be beneficial with hx of seizure and unclear the ultimate side affects

## 2020-03-10 NOTE — Assessment & Plan Note (Signed)
Given and discussed precautions below.   SEIZURE CAUTIONS and WARNINGS --Per [facility] statutes, patients with seizures are not allowed to drive until  they have been seizure-free for six months.   - They are to use caution when using heavy equipment or power tools.  - They are to avoid working on ladders or at heights.  - They are to take showers instead of baths.  - They are to ensure the water temperature is not too high on the home water heater.  - They are not to go swimming alone.   - When caring for infants or small children, patients with seizures are to sit down when holding, feeding, or changing them to minimize risk of injury to the child in the event having a seizure.

## 2020-05-13 ENCOUNTER — Other Ambulatory Visit: Payer: Medicaid Other

## 2020-05-13 DIAGNOSIS — Z20822 Contact with and (suspected) exposure to covid-19: Secondary | ICD-10-CM

## 2020-05-15 LAB — NOVEL CORONAVIRUS, NAA: SARS-CoV-2, NAA: NOT DETECTED

## 2020-05-15 LAB — SARS-COV-2, NAA 2 DAY TAT

## 2020-05-18 ENCOUNTER — Other Ambulatory Visit: Payer: Self-pay

## 2020-05-18 ENCOUNTER — Ambulatory Visit (INDEPENDENT_AMBULATORY_CARE_PROVIDER_SITE_OTHER): Payer: 59 | Admitting: Family Medicine

## 2020-05-18 ENCOUNTER — Other Ambulatory Visit (HOSPITAL_COMMUNITY)
Admission: RE | Admit: 2020-05-18 | Discharge: 2020-05-18 | Disposition: A | Discharge status: Home / Self Care | Payer: 59 | Source: Ambulatory Visit | Attending: Family Medicine | Admitting: Family Medicine

## 2020-05-18 VITALS — BP 110/80 | HR 78 | Temp 98.4°F

## 2020-05-18 DIAGNOSIS — J029 Acute pharyngitis, unspecified: Secondary | ICD-10-CM

## 2020-05-18 DIAGNOSIS — B9689 Other specified bacterial agents as the cause of diseases classified elsewhere: Secondary | ICD-10-CM | POA: Insufficient documentation

## 2020-05-18 DIAGNOSIS — J019 Acute sinusitis, unspecified: Secondary | ICD-10-CM | POA: Diagnosis not present

## 2020-05-18 HISTORY — DX: Acute pharyngitis, unspecified: J02.9

## 2020-05-18 LAB — POCT RAPID STREP A (OFFICE): Rapid Strep A Screen: NEGATIVE

## 2020-05-18 MED ORDER — AMOXICILLIN 875 MG PO TABS: 875.0000 mg | ORAL_TABLET | Freq: Two times a day (BID) | ORAL | 0 refills | Status: AC

## 2020-05-18 NOTE — Progress Notes (Signed)
    SUBJECTIVE:   CHIEF COMPLAINT / HPI:   Sore throat Started 6 days ago No fevers  Endorses congestion, rhinorrhea, sore throat, minor cough Denies headache, fatigue, myalgias, nausea, vomiting, diarrhea, chills, abd pain Has been drinking normally, has normal UOP  No known sick contacts aside from coworkers with COVID Has been tested for COVID on 1/21 and 1/24 at [facility]) and these were negative She would be willing to have throat swabbed for G/C, is in a monogamous relationship, but has not had her throat swabbed previously for this  PERTINENT  PMH / PSH: Non-contributory  OBJECTIVE:   BP 110/80   Pulse 78   Temp 98.4 F (36.9 C) (Oral)   SpO2 99%    Physical Exam:  General: 25 y.o. female in NAD HEENT: NCAT, sclera sclear, b/l nares with congestion, throat erythematous without lesions or exudate, TTP frontal and maxillary sinuses, right TM clear, MMM Neck: supple, right sided tender cervical LAD Cardio: RRR no m/r/g Lungs: CTAB, no wheezing, no rhonchi, no crackles, no IWOB on RA Skin: warm and dry Extremities: ambulating without difficulty   Results for orders placed or performed in visit on 05/18/20 (from the past 24 hour(s))  POCT rapid strep A     Status: None   Collection Time: 05/18/20  4:15 PM  Result Value Ref Range   Rapid Strep A Screen Negative Negative     ASSESSMENT/PLAN:   Sore throat Rapid strep negative.  Will hold off on culture as she will be treated for acute bacterial sinusitis with amoxicillin which would cover for strep pharyngitis and would not change plan.  Has had negative HIV and HSV within the last year.  Very low suspicion for syphilis and no visible lesions.  G/C swab performed to rule out gonorrhea as cause of throat pain.  Will call and treat if positive.  Could consider mononucleosis, although would not change plan as patient reports she does not participate in contact sports.  No need to repeat COVID testing.  Rest of  plan per below.  Acute bacterial rhinosinusitis Given length of symptoms, lack of improvement, and tenderness of sinuses, will go ahead and treat with amoxicillin 875mg  BID x 10 days, which would also cover for strep pharyngitis.  RTC if no improvement of if symptoms worsen.     , DO [facility]

## 2020-05-18 NOTE — Assessment & Plan Note (Signed)
Given length of symptoms, lack of improvement, and tenderness of sinuses, will go ahead and treat with amoxicillin 875mg  BID x 10 days, which would also cover for strep pharyngitis.  RTC if no improvement of if symptoms worsen.

## 2020-05-18 NOTE — Assessment & Plan Note (Signed)
Rapid strep negative.  Will hold off on culture as she will be treated for acute bacterial sinusitis with amoxicillin which would cover for strep pharyngitis and would not change plan.  Has had negative HIV and HSV within the last year.  Very low suspicion for syphilis and no visible lesions.  G/C swab performed to rule out gonorrhea as cause of throat pain.  Will call and treat if positive.  Could consider mononucleosis, although would not change plan as patient reports she does not participate in contact sports.  No need to repeat COVID testing.  Rest of plan per below.

## 2020-05-18 NOTE — Patient Instructions (Signed)
Thank you for coming to see me today. It was a pleasure. Today we talked about:   We will let you know when your throat swab comes back.  Take amoxicillin twice daily for the next 10 days.  If no improvement or if you worsen or develop fevers, please come back.  If you have any questions or concerns, please do not hesitate to call the office at [phone].  Best,   Luis Abed, DO

## 2020-05-20 LAB — CERVICOVAGINAL ANCILLARY ONLY
Chlamydia: NEGATIVE
Comment: NEGATIVE
Comment: NORMAL
Neisseria Gonorrhea: NEGATIVE

## 2020-07-01 ENCOUNTER — Other Ambulatory Visit: Payer: Self-pay

## 2020-07-01 ENCOUNTER — Encounter: Payer: Self-pay | Admitting: Internal Medicine

## 2020-07-01 ENCOUNTER — Ambulatory Visit: Payer: 59 | Admitting: Internal Medicine

## 2020-07-01 VITALS — BP 110/70 | HR 81 | Ht 68.0 in | Wt 203.0 lb

## 2020-07-01 DIAGNOSIS — I340 Nonrheumatic mitral (valve) insufficiency: Secondary | ICD-10-CM | POA: Insufficient documentation

## 2020-07-01 DIAGNOSIS — R55 Syncope and collapse: Secondary | ICD-10-CM | POA: Insufficient documentation

## 2020-07-01 NOTE — Patient Instructions (Signed)
Medication Instructions:  Your physician recommends that you continue on your current medications as directed. Please refer to the Current Medication list given to you today.  *If you need a refill on your cardiac medications before your next appointment, please call your pharmacy*   Lab Work: NONE If you have labs (blood work) drawn today and your tests are completely normal, you will receive your results only by: Marland Kitchen MyChart Message (if you have MyChart) OR . A paper copy in the mail If you have any lab test that is abnormal or we need to change your treatment, we will call you to review the results.   Testing/Procedures: Your physician has requested that you have an echocardiogram in 2 years. Echocardiography is a painless test that uses sound waves to create images of your heart. It provides your doctor with information about the size and shape of your heart and how well your heart's chambers and valves are working. This procedure takes approximately one hour. There are no restrictions for this procedure.     Follow-Up: At [facility], you and your health needs are our priority.  As part of our continuing mission to provide you with exceptional heart care, we have created designated Provider Care Teams.  These Care Teams include your primary Cardiologist (physician) and Advanced Practice Providers (APPs -  Physician Assistants and Nurse Practitioners) who all work together to provide you with the care you need, when you need it.  We recommend signing up for the patient portal called "MyChart".  Sign up information is provided on this After Visit Summary.  MyChart is used to connect with patients for Virtual Visits (Telemedicine).  Patients are able to view lab/test results, encounter notes, upcoming appointments, etc.  Non-urgent messages can be sent to your provider as well.   To learn more about what you can do with MyChart, go to [email or web address].    Your next appointment:    24  month(s)  The format for your next appointment:   In Person  Provider:   You may see Christell Constant, MD or one of the following Advanced Practice Providers on your designated Care Team:    Ronie Spies, PA-C  Jacolyn Reedy, PA-C

## 2020-07-01 NOTE — Progress Notes (Signed)
.  Cardiology Office Note:    Date:  07/01/2020   ID:  Kelly Morton, DOB 07-Nov-1995, MRN [id number]  PCP:  Lavonda Jumbo, DO  [facility] Cardiologist:  Christell Constant, MD  [facility] Electrophysiologist:  None   CC: Follow up Syncope  History of Present Illness:    Kelly Morton is a 25 y.o. female with a hx of syncope, PTSD, chronic migraine, and hx of IDA last seen 01/01/20.  Possible MAD on echo.  In interim of this visit, patient has not has passing out since.  Patient notes that she is doing well.  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  No testing. There are no interval hospital/ED visit.    No chest pain or pressure .  No SOB/DOE and no PND/Orthopnea.  No weight gain or leg swelling.  No palpitations or syncope.   Past Medical History:  Diagnosis Date  . Ovarian cyst   . PTSD (post-traumatic stress disorder)   . Syncope     Past Surgical History:  Procedure Laterality Date  . OOPHORECTOMY    . TONSILLECTOMY      Current Medications: Current Meds  Medication Sig  . acetaminophen (TYLENOL) 325 MG tablet Take 650 mg by mouth every 6 (six) hours as needed for mild pain.  Marland Kitchen aspirin-acetaminophen-caffeine (EXCEDRIN MIGRAINE) 250-250-65 MG tablet Take 3 tablets by mouth every 6 (six) hours as needed for headache or migraine.   Marland Kitchen escitalopram (LEXAPRO) 10 MG tablet Take by mouth daily.  Marland Kitchen ibuprofen (ADVIL) 600 MG tablet Take 600 mg by mouth every 6 (six) hours as needed for moderate pain or cramping.     Allergies:   Fire ant, Bactrim [sulfamethoxazole-trimethoprim], and Elemental sulfur   Social History   Socioeconomic History  . Marital status: Significant Other    Spouse name: Not on file  . Number of children: Not on file  . Years of education: Not on file  . Highest education level: Not on file  Occupational History  . Not on file  Tobacco Use   . Smoking status: Never Smoker  . Smokeless tobacco: Never Used  Substance and Sexual Activity  . Alcohol use: Yes    Alcohol/week: 6.0 standard drinks    Types: 6 Glasses of wine per week  . Drug use: Never  . Sexual activity: Not on file  Other Topics Concern  . Not on file  Social History Narrative  . 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  Grandmother has arrhythmia lived to 39; no SCD, drowning's car accidents.  ROS:   Please see the history of present illness.    All other systems reviewed and are negative.  EKGs/Labs/Other Studies Reviewed:    The following studies were reviewed today:  EKG: 07/01/20: Sinus Rhythm 81 short PR  Cardiac Event Monitoring: Date:  01/14/20 Results:  Avg HR 79, min 44 bpm max 171 bpm  Predominant rhythm: Sinus Rhythm  1 3s Pause occurred  Isolated PVCs < 1.0%  Isolated PACs < 1.0%  Diary Events (Anxious X2, Dizziness X3, Frightened X1 Shaky X1) associated with sinus rhythm  Triggered Events Associated with sinus rhythm or sinus tachycardia   Transthoracic Echocardiogram: Date: 12/17/19 Results:  subtle PLAX evidence of Mitral Annular Disjunction, disjunction distance < 10 mm 1. Mild bileaflet mitral valve prolapse with mild mitral annular  disjunction. Displacement distance 6 mm. The mitral valve is myxomatous.  Mild mitral valve  regurgitation. No evidence of mitral stenosis.  2. Left ventricular ejection fraction, by estimation, is 60 to 65%. The  left ventricle has normal function. The left ventricle has no regional  wall motion abnormalities. Left ventricular diastolic parameters were  normal. The average left ventricular  global longitudinal strain is -20.4 %. The global longitudinal strain is  normal.  3. Right ventricular systolic function is normal. The right ventricular  size is normal.  4. The aortic valve is tricuspid. Aortic valve regurgitation is not  visualized. No aortic stenosis is present.   Recent Labs: 12/17/2019: ALT 12; BUN 11; Creatinine, Ser 0.73; Hemoglobin 13.7; Magnesium 2.2; Platelets 247; Potassium 4.0; Sodium 137; TSH 5.819  Recent Lipid Panel No results found for: CHOL, TRIG, HDL, CHOLHDL, VLDL, LDLCALC, LDLDIRECT    Physical Exam:    VS:  BP 110/70 (BP Location: Left Arm, Patient Position: Sitting, Cuff Size: Normal)   Pulse 81   Ht 5\' 8"  (1.727 m)   Wt 203 lb (92.1 kg)   SpO2 97%   BMI 30.87 kg/m     Wt Readings from Last 3 Encounters:  07/01/20 203 lb (92.1 kg)  03/07/20 193 lb 6.4 oz (87.7 kg)  01/01/20 189 lb (85.7 kg)    Orthostatic Vitals: Supine:  BP 121/76  HR 86 Sitting:  BP 121/84  HR 95 Standing:  BP 133/85  HR 90 Prolonged Stand:  BP 124/86  HR 92  GEN:  Well nourished, well developed in no acute distress HEENT: Normal NECK: No JVD; No carotid bruits LYMPHATICS: No lymphadenopathy CARDIAC: RRR, no murmurs, rubs, gallops RESPIRATORY:  Clear to auscultation without rales, wheezing or rhonchi  ABDOMEN: Soft, non-tender, non-distended MUSCULOSKELETAL:  No edema; No deformity  SKIN: Warm and dry bilateral wrist tattoos (sun and lion) NEUROLOGIC:  Alert and oriented x 3 PSYCHIATRIC:  Normal affect   ASSESSMENT:    1. Syncope and collapse   2. Vasovagal syncope   3. Mild mitral regurgitation    PLAN:    In order of problems listed  above:  Vasovagal Syncope - vasovagal syncope related to tension in muscles and temperature - stress the importance of salt and water intake - gave education on slow rise, Valsalva maneuver exacerbation, temperature change - discussed muscle contraction and leg crossing - will refer to health and wellness center:  exercise deconditioning exacerbate ethis issue, optimally benefits from bike/row or swimming; discussed weight lifting management  Mitral Annular Dysjunction Mild Mitral Regurgitation - Will need echo in two years   Medication Adjustments/Labs and Tests Ordered: Current medicines are reviewed at length with the patient today.  Concerns regarding medicines are outlined above.  Orders Placed This Encounter  Procedures  . EKG 12-Lead   No orders of the defined types were placed in this encounter.   Patient Instructions  Medication Instructions:  Your physician recommends that you continue on your current medications as  directed. Please refer to the Current Medication list given to you today.  *If you need a refill on your cardiac medications before your next appointment, please call your pharmacy*   Lab Work: NONE If you have labs (blood work) drawn today and your tests are completely normal, you will receive your results only by: Marland Kitchen MyChart Message (if you have MyChart) OR . A paper copy in the mail If you have any lab test that is abnormal or we need to change your treatment, we will call you to review the results.   Testing/Procedures: Your physician has requested that you have an echocardiogram in 2 years. Echocardiography is a painless test that uses sound waves to create images of your heart. It provides your doctor with information about the size and shape of your heart and how well your heart's chambers and valves are working. This procedure takes approximately one hour. There are no restrictions for this procedure.     Follow-Up: At [facility], you and  your health needs are our priority.  As part of our continuing mission to provide you with exceptional heart care, we have created designated Provider Care Teams.  These Care Teams include your primary Cardiologist (physician) and Advanced Practice Providers (APPs -  Physician Assistants and Nurse Practitioners) who all work together to provide you with the care you need, when you need it.  We recommend signing up for the patient portal called "MyChart".  Sign up information is provided on this After Visit Summary.  MyChart is used to connect with patients for Virtual Visits (Telemedicine).  Patients are able to view lab/test results, encounter notes, upcoming appointments, etc.  Non-urgent messages can be sent to your provider as well.   To learn more about what you can do with MyChart, go to [email or web address].    Your next appointment:   24  month(s)  The format for your next appointment:   In Person  Provider:   You may see Christell Constant, MD or one of the following Advanced Practice Providers on your designated Care Team:    Ronie Spies, PA-C  Jacolyn Reedy, PA-C          Signed, Christell Constant, MD  07/01/2020 9:47 AM    [facility]

## 2020-08-22 ENCOUNTER — Ambulatory Visit (INDEPENDENT_AMBULATORY_CARE_PROVIDER_SITE_OTHER): Payer: 59 | Admitting: Family Medicine

## 2020-08-22 ENCOUNTER — Encounter: Payer: Self-pay | Admitting: Family Medicine

## 2020-08-22 ENCOUNTER — Other Ambulatory Visit: Payer: Self-pay

## 2020-08-22 VITALS — BP 110/70 | HR 82 | Ht 68.0 in | Wt 209.2 lb

## 2020-08-22 DIAGNOSIS — R1114 Bilious vomiting: Secondary | ICD-10-CM

## 2020-08-22 NOTE — Progress Notes (Signed)
SUBJECTIVE:   CHIEF COMPLAINT / HPI:   Vomiting/Diarrhea: 2 weeks ago April 20th had episode of burning fullness in chest/upper abdomen, was bloated. At that time vomited some bile with 2 streaks of red blood. Has not had blood in vomitus since then. That day she took 2 Tums and took Zofran. Zofran took a while to kick in, Tums didn't improve her symptoms much. When asked about anxiety at night, she reports that only 1 time last week she had an anxiety issue with having a PTSD nightmare, felt nauseous, but has had no nightmares otherwise. Vomitus at that time looked like processed food.   Today she woke up at 2:33am, felt nauseous (no feeling of anxiousness), vomited bile with some food. She reports she did not feel better after vomiting. Went back to bed. She then woke up again, had an episode of nonbloody diarrhea and then vomited again. Went back to sleep, woke up again at 4:22 and vomited again, vomitus was bile with food stuffs. She threw up water today but was able to keep down ginger ale. Only having occasional crampy abdominal pain directly over stomach area, no liver pain, no pain to back or bladder. She is currently menstruating, currently denies polyuria, polyphagia, dysuria. Denies burning at this time to epigastric region.  Denies shortness of breath or pain with breathing. Other than the couple episodes of diarrhea has been stooling normally. Symptoms do not worsen or improve with consuming food/drink.  PERTINENT  PMH / PSH:  Patient Active Problem List   Diagnosis Date Noted  . Bilious vomiting with nausea 08/24/2020  . Vasovagal syncope 07/01/2020  . Mild mitral regurgitation 07/01/2020  . Sore throat 05/18/2020  . Acute bacterial rhinosinusitis 05/18/2020  . Encounter to establish care 03/10/2020  . Seizure-like activity (HCC) 12/17/2019     OBJECTIVE:   BP 110/70   Pulse 82   Ht 5\' 8"  (1.727 m)   Wt 209 lb 4 oz (94.9 kg)   LMP 08/18/2020   SpO2 99%   BMI 31.82  kg/m    Physical exam: General: well appearing, very pleasant, nontoxic appearing, no acute distress at this time Respiratory: comfortable work of breathing on room air, CTA bilaterally Cardio: RRR, S1S2 present Abdomen: normal bowel sounds appreciated, soft to palpation, no masses, moderate tenderness to palpation of epigastric region and    ASSESSMENT/PLAN:   Bilious vomiting with nausea Patient has been experiencing about 2 weeks of vomiting with diarrhea. Vomitus is usually somewhat bilious or contains food content. On 1 occasional has had 2 thin, mucusy streaks of blood. She has had 2 episodes of nonbloody diarrhea, has otherwise been eating and stooling normally. She reports some burning in the chest/abdomen at first, however this has since subsided with Tums. Also tried Zofran but this did not improve her symptoms. Gastritis is possible cause of her symptoms due to sensation of burning and fullness in her abdomen, and that she take ibuprofen occasionally for pain, however her clinical picture and history do not quite match gastritis. No blood in vomit/diarrhea to be concerned for GI bleed. Eating/drinking does not change discomfort so therefore Gastric vs Duodenal ulcer is less likely. She probably has some component of GERD and/or Functional Dyspepsia.  -Will check today for H. Pylori to rule this out as treatable cause.  -Patient can try Align, FloraStor, Culturelle, or any of the other probiotics. Aim for 1 dose of 6 billion+ units. Patient also instructed to incorporate natural probiotics into diet with  Kimchi, Kombucha, [place] Yoghurt and/or Sauerkraut.  -Pending H pylori results, will need to treat if positive. Otherwise can trial patient on PPI such as Omeprazole.  -Strict return precautions provided   Dollene Cleveland, DO [facility]

## 2020-08-22 NOTE — Patient Instructions (Addendum)
Thank you for coming in to see [place] today! Please see below to review our plan for today's visit:  1. You can try Align, FloraStor, Culturelle, or any of the other probiotics. Aim for 1 dose of 6 billion+ units. You can also incorporate natural probiotics into your diet with Kimchi, Kombucha, [place] Yoghurt and/or Sauerkraut.  2. We are checking H pylori and will contact you with results and next steps.  3. We may start you on a PPI such as Omeprazole.   4. If you experience worsening pains, nausea, vomiting, diarrhea, blood in stools/vomit, inability to stay hdyrated, or any other worsening symptoms please seek emergency medical care.   Please call the clinic at [phone] if your symptoms worsen or you have any concerns. It was our pleasure to serve you!   Dr. Peggyann Shoals [facility]

## 2020-08-23 ENCOUNTER — Other Ambulatory Visit: Payer: 59

## 2020-08-23 DIAGNOSIS — R1114 Bilious vomiting: Secondary | ICD-10-CM

## 2020-08-24 ENCOUNTER — Ambulatory Visit (INDEPENDENT_AMBULATORY_CARE_PROVIDER_SITE_OTHER): Payer: 59 | Admitting: Family Medicine

## 2020-08-24 ENCOUNTER — Encounter: Payer: Self-pay | Admitting: Family Medicine

## 2020-08-24 ENCOUNTER — Other Ambulatory Visit: Payer: Self-pay

## 2020-08-24 VITALS — BP 113/70 | HR 73 | Ht 68.0 in | Wt 207.2 lb

## 2020-08-24 DIAGNOSIS — R1114 Bilious vomiting: Secondary | ICD-10-CM | POA: Insufficient documentation

## 2020-08-24 DIAGNOSIS — A048 Other specified bacterial intestinal infections: Secondary | ICD-10-CM | POA: Diagnosis not present

## 2020-08-24 DIAGNOSIS — Z23 Encounter for immunization: Secondary | ICD-10-CM | POA: Diagnosis not present

## 2020-08-24 DIAGNOSIS — R590 Localized enlarged lymph nodes: Secondary | ICD-10-CM

## 2020-08-24 NOTE — Assessment & Plan Note (Addendum)
Patient has been experiencing about 2 weeks of vomiting with diarrhea. Vomitus is usually somewhat bilious or contains food content. On 1 occasional has had 2 thin, mucusy streaks of blood. She has had 2 episodes of nonbloody diarrhea, has otherwise been eating and stooling normally. She reports some burning in the chest/abdomen at first, however this has since subsided with Tums. Also tried Zofran but this did not improve her symptoms. Gastritis is possible cause of her symptoms due to sensation of burning and fullness in her abdomen, and that she take ibuprofen occasionally for pain, however her clinical picture and history do not quite match gastritis. No blood in vomit/diarrhea to be concerned for GI bleed. Eating/drinking does not change discomfort so therefore Gastric vs Duodenal ulcer is less likely. She probably has some component of GERD and/or Functional Dyspepsia.  -Will check today for H. Pylori to rule this out as treatable cause.  -Patient can try Align, FloraStor, Culturelle, or any of the other probiotics. Aim for 1 dose of 6 billion+ units. Patient also instructed to incorporate natural probiotics into diet with Kimchi, Kombucha, [place] Yoghurt and/or Sauerkraut.  -Pending H pylori results, will need to treat if positive. Otherwise can trial patient on PPI such as Omeprazole.  -Strict return precautions provided

## 2020-08-24 NOTE — Patient Instructions (Signed)
Lymphadenopathy  Lymphadenopathy means that your lymph glands are swollen or larger than normal. Lymph glands, also called lymph nodes, are collections of tissue that filter excess fluid, bacteria, viruses, and waste from your bloodstream. They are part of your body's disease-fighting system (immune system), which protects your body from germs. There may be different causes of lymphadenopathy, depending on where it is in your body. Some types go away on their own. Lymphadenopathy can occur anywhere that you have lymph glands, including these areas:  Neck (cervical lymphadenopathy).  Chest (mediastinal lymphadenopathy).  Lungs (hilar lymphadenopathy).  Underarms (axillary lymphadenopathy).  Groin (inguinal lymphadenopathy). When your immune system responds to germs, infection-fighting cells and fluid build up in your lymph glands. This causes some swelling and enlargement. If the lymph nodes do not go back to normal size after you have an infection or disease, your health care provider may do tests. These tests help to monitor your condition and find the reason why the glands are still swollen and enlarged. Follow these instructions at home:  Get plenty of rest.  Your health care provider may recommend over-the-counter medicines for pain. Take over-the-counter and prescription medicines only as told by your health care provider.  If directed, apply heat to swollen lymph glands as often as told by your health care provider. Use the heat source that your health care provider recommends, such as a moist heat pack or a heating pad. ? Place a towel between your skin and the heat source. ? Leave the heat on for 20-30 minutes. ? Remove the heat if your skin turns bright red. This is especially important if you are unable to feel pain, heat, or cold. You may have a greater risk of getting burned.  Check your affected lymph glands every day for changes. Check other lymph gland areas as told by your  health care provider. Check for changes such as: ? More swelling. ? Sudden increase in size. ? Redness or pain. ? Hardness.  Keep all follow-up visits. This is important.   Contact a health care provider if you have:  Lymph glands that: ? Are still swollen after 2 weeks. ? Have suddenly gotten bigger or the swelling spreads. ? Are red, painful, or hard.  Fluid leaking from the skin near an enlarged lymph gland.  Problems with breathing.  A fever, chills, or night sweats.  Fatigue.  A sore throat.  Pain in your abdomen.  Weight loss. Get help right away if you have:  Severe pain.  Chest pain.  Shortness of breath. These symptoms may represent a serious problem that is an emergency. Do not wait to see if the symptoms will go away. Get medical help right away. Call your local emergency services (911 in the [place] Do not drive yourself to the hospital. Summary  Lymphadenopathy means that your lymph glands are swollen or larger than normal.  Lymph glands, also called lymph nodes, are collections of tissue that filter excess fluid, bacteria, viruses, and waste from the bloodstream. They are part of your body's disease-fighting system (immune system).  Lymphadenopathy can occur anywhere that you have lymph glands.  If the lymph nodes do not go back to normal size after you have an infection or disease, your health care provider may do tests to monitor your condition and find the reason why the glands are still swollen and enlarged.  Check your affected lymph glands every day for changes. Check other lymph gland areas as told by your health care provider.

## 2020-08-24 NOTE — Progress Notes (Signed)
    SUBJECTIVE:   CHIEF COMPLAINT / HPI:   Left anterior cervical: patient reports that last night on 5/3 she started having minor lymphadenopathy of the left side of her neck. She is not having any other symptoms such as runny nose, stuffy nose, sore throat, fevers, chills, or body aches. Denies difficulty swallowing. She is well appearing. No concerns at this time for COVID, Flu.   Abdominal pain: patient previously seen for abdominal pain, nausea, vomiting. Today she reports she has not had any nausea since this morning, also denies vomiting/diarrhea, she had loose stools but no diarrhea. She had her H. Pylori breath test to rule that out as a cause of her discomfort and vomiting, however results are still pending.  PERTINENT  PMH / PSH:  Patient Active Problem List   Diagnosis Date Noted  . H. pylori infection 08/28/2020  . LAD (lymphadenopathy), anterior cervical 08/28/2020  . Bilious vomiting with nausea 08/24/2020  . Vasovagal syncope 07/01/2020  . Mild mitral regurgitation 07/01/2020  . Sore throat 05/18/2020  . Acute bacterial rhinosinusitis 05/18/2020  . Encounter to establish care 03/10/2020  . Seizure-like activity (HCC) 12/17/2019    OBJECTIVE:   BP 113/70   Pulse 73   Ht 5\' 8"  (1.727 m)   Wt 207 lb 3.2 oz (94 kg)   LMP 08/18/2020   SpO2 98%   BMI 31.50 kg/m    Physical exam: General: well-appearing on exam, nontoxic-appearing, no acute distress HEENT: Normocephalic, atraumatic, EOMI, PERRLA, patent nares without evidence of rhinorrhea, normal-appearing TMs appreciated bilaterally, no pharyngeal erythema or tonsillar exudates Neck: Mild left anterior cervical lymphadenopathy with largest lymph node measuring about 5-7 mm Respiratory: CTA bilaterally, comfortable work of breathing on room air Cardio: RRR, S1-S2 present, no murmurs appreciated   ASSESSMENT/PLAN:   LAD (lymphadenopathy), anterior cervical Patient with 1 day of left-sided lymphadenopathy,  relatively mild on physical exam.  Most likely reactive, could be beginnings of patient developing viral URI.  At this time vitals are stable, patient afebrile, no dysphagia or odynophagia appreciated. -Can continue to monitor  H. pylori infection Patient's H. pylori breath test returned positive. -Initiate triple therapy with omeprazole 20 mg twice daily, clindamycin 500 mg twice daily, and amoxicillin 1000 mg twice daily for total of 14 weeks. -We will asked patient to follow-up for repeat breath test as indicated     08/20/2020, DO [facility]

## 2020-08-25 LAB — H. PYLORI BREATH TEST: H pylori Breath Test: POSITIVE — AB

## 2020-08-28 DIAGNOSIS — A048 Other specified bacterial intestinal infections: Secondary | ICD-10-CM

## 2020-08-28 DIAGNOSIS — R590 Localized enlarged lymph nodes: Secondary | ICD-10-CM | POA: Insufficient documentation

## 2020-08-28 HISTORY — DX: Other specified bacterial intestinal infections: A04.8

## 2020-08-28 MED ORDER — OMEPRAZOLE MAGNESIUM 20 MG PO TBEC: 20.0000 mg | DELAYED_RELEASE_TABLET | Freq: Two times a day (BID) | ORAL | 0 refills | Status: DC

## 2020-08-28 MED ORDER — AMOXICILLIN 500 MG PO CAPS: 1000.0000 mg | ORAL_CAPSULE | Freq: Two times a day (BID) | ORAL | 0 refills | Status: AC

## 2020-08-28 MED ORDER — CLARITHROMYCIN 500 MG PO TABS: 500.0000 mg | ORAL_TABLET | Freq: Two times a day (BID) | ORAL | 0 refills | Status: AC

## 2020-08-28 NOTE — Assessment & Plan Note (Signed)
Patient with 1 day of left-sided lymphadenopathy, relatively mild on physical exam.  Most likely reactive, could be beginnings of patient developing viral URI.  At this time vitals are stable, patient afebrile, no dysphagia or odynophagia appreciated. -Can continue to monitor

## 2020-08-28 NOTE — Assessment & Plan Note (Signed)
Patient's H. pylori breath test returned positive. -Initiate triple therapy with omeprazole 20 mg twice daily, clindamycin 500 mg twice daily, and amoxicillin 1000 mg twice daily for total of 14 weeks. -We will asked patient to follow-up for repeat breath test as indicated

## 2020-12-30 ENCOUNTER — Ambulatory Visit: Payer: 59 | Admitting: Family Medicine

## 2021-02-20 ENCOUNTER — Ambulatory Visit (INDEPENDENT_AMBULATORY_CARE_PROVIDER_SITE_OTHER): Payer: 59 | Admitting: Family Medicine

## 2021-02-20 ENCOUNTER — Other Ambulatory Visit: Payer: Self-pay

## 2021-02-20 ENCOUNTER — Encounter: Payer: Self-pay | Admitting: Family Medicine

## 2021-02-20 ENCOUNTER — Telehealth: Payer: Self-pay

## 2021-02-20 VITALS — BP 123/81 | HR 85 | Wt 197.2 lb

## 2021-02-20 DIAGNOSIS — R109 Unspecified abdominal pain: Secondary | ICD-10-CM

## 2021-02-20 DIAGNOSIS — R1012 Left upper quadrant pain: Secondary | ICD-10-CM

## 2021-02-20 LAB — POCT URINE PREGNANCY: Preg Test, Ur: NEGATIVE

## 2021-02-20 MED ORDER — ONDANSETRON HCL 4 MG/2ML IJ SOLN
4.0000 mg | Freq: Once | INTRAMUSCULAR | Status: AC
  Administered 2021-02-20: 4 mg via INTRAMUSCULAR

## 2021-02-20 MED ORDER — ONDANSETRON 4 MG PO TBDP: 4.0000 mg | ORAL_TABLET | Freq: Three times a day (TID) | ORAL | 0 refills | Status: DC | PRN

## 2021-02-20 NOTE — Telephone Encounter (Signed)
Patient calls nurse line regarding vomiting. Onset around 12 hours ago. Patient reports that symptoms are similar to when she was seen in May. Patient was requesting refill on Zofran. Advised patient that she should be re evaluated prior to rx refill.  Denies fever and diarrhea.   Spoke with Dr. McDiarmid who advised that patient could be seen in clinic.   Veronda Prude, RN

## 2021-02-20 NOTE — Progress Notes (Signed)
    SUBJECTIVE:   CHIEF COMPLAINT / HPI: abdominal pain and emesis   Abdominal  Patient feels that her stomach is "on fire". She reports that symptoms have been prsent for one day. She reports that she had three mixed drinks prior to onset of pain. She normally will drink one at time if during the week but three to four on weekends. She has never had reaction like this before. She denies diarrhea, HA, fever. She takes Lexapro and birth control. She reports having a panic attack two days ago.  Patient states that she has not had any additional symptoms including urinary symptoms.  PERTINENT  PMH / PSH:  Age pylori  OBJECTIVE:   BP 123/81   Pulse 85   Wt 197 lb 3.2 oz (89.4 kg)   LMP 01/29/2021   SpO2 100%   BMI 29.98 kg/m   Physical Exam Constitutional:      General: She is not in acute distress.    Appearance: She is well-developed. She is ill-appearing. She is not diaphoretic.  HENT:     Mouth/Throat:     Mouth: Mucous membranes are moist.     Pharynx: No pharyngeal swelling.  Eyes:     General: No scleral icterus. Abdominal:     General: Bowel sounds are normal. There is no distension.     Palpations: Abdomen is soft. There is no fluid wave, hepatomegaly or mass.     Tenderness: There is generalized abdominal tenderness and tenderness in the left upper quadrant.  Skin:    Capillary Refill: Capillary refill takes 2 to 3 seconds.     Coloration: Skin is not cyanotic or pale.     Findings: No erythema or rash.  Neurological:     Mental Status: She is alert.    ASSESSMENT/PLAN:   Pain, abdominal, LUQ Patient presents with 1 day of left upper quadrant pain associated with bilious vomiting.  Unable to tolerate liquid or solids without emesis.  Will check CMP, lipase, H. pylori today given patient's past infection.  We will also treat with dose of Zofran here in the office as well as a prescription.  Counseled patient on reasons to go to ED for further evaluation if pain  worsens or she is unable to tolerate p.o. for hydration purposes.  Patient voices understanding. Negative pregnancy test      Ronnald Ramp, MD [facility]

## 2021-02-20 NOTE — Assessment & Plan Note (Addendum)
Patient presents with 1 day of left upper quadrant pain associated with bilious vomiting.  Unable to tolerate liquid or solids without emesis.  Will check CMP, lipase, H. pylori today given patient's past infection.  We will also treat with dose of Zofran here in the office as well as a prescription.  Counseled patient on reasons to go to ED for further evaluation if pain worsens or she is unable to tolerate p.o. for hydration purposes.  Patient voices understanding. Negative pregnancy test

## 2021-02-20 NOTE — Patient Instructions (Signed)
   WithI have prescribed Zofran and we will also give you a dose of Zofran here in the office for your vomiting.  We will test her Fredirick Lathe to see if there is any type of infection that could be causing your abdominal pain.  If you continue to have worsening abdominal pain or are unable to tolerate any fluids in order to maintain hydration, I recommend that she be evaluated in the ED as you may need imaging to confirm that there is no abnormal process causing abdominal pain.  Please plan to follow-up with our office in 3 to 4 days to check on your abdominal pain.

## 2021-02-21 LAB — COMPREHENSIVE METABOLIC PANEL
ALT: 13 IU/L (ref 0–32)
AST: 13 IU/L (ref 0–40)
Albumin/Globulin Ratio: 2.4 — ABNORMAL HIGH (ref 1.2–2.2)
Albumin: 5.1 g/dL — ABNORMAL HIGH (ref 3.9–5.0)
Alkaline Phosphatase: 72 IU/L (ref 44–121)
BUN/Creatinine Ratio: 18 (ref 9–23)
BUN: 13 mg/dL (ref 6–20)
Bilirubin Total: 0.5 mg/dL (ref 0.0–1.2)
CO2: 24 mmol/L (ref 20–29)
Calcium: 9.8 mg/dL (ref 8.7–10.2)
Chloride: 103 mmol/L (ref 96–106)
Creatinine, Ser: 0.71 mg/dL (ref 0.57–1.00)
Globulin, Total: 2.1 g/dL (ref 1.5–4.5)
Glucose: 87 mg/dL (ref 70–99)
Potassium: 4.5 mmol/L (ref 3.5–5.2)
Sodium: 144 mmol/L (ref 134–144)
Total Protein: 7.2 g/dL (ref 6.0–8.5)
eGFR: 121 mL/min/{1.73_m2} (ref >59)

## 2021-02-21 LAB — LIPASE: Lipase: 18 U/L (ref 14–72)

## 2021-02-22 LAB — H. PYLORI BREATH TEST: H pylori Breath Test: NEGATIVE

## 2021-04-27 ENCOUNTER — Telehealth: Payer: Self-pay | Admitting: *Deleted

## 2021-04-27 DIAGNOSIS — F339 Major depressive disorder, recurrent, unspecified: Secondary | ICD-10-CM

## 2021-04-27 DIAGNOSIS — F445 Conversion disorder with seizures or convulsions: Secondary | ICD-10-CM

## 2021-04-27 DIAGNOSIS — F419 Anxiety disorder, unspecified: Secondary | ICD-10-CM

## 2021-04-27 NOTE — Telephone Encounter (Signed)
Patient called needing a new referral for psychiatry.  Her previous online office is no longer open and she is able to be seen at [facility] but will need a referral.  Will forward to MD to place this order.  Lenix Kidd,CMA

## 2021-05-12 ENCOUNTER — Other Ambulatory Visit: Payer: Self-pay

## 2021-05-12 ENCOUNTER — Inpatient Hospital Stay: Payer: 59 | Attending: Internal Medicine

## 2021-05-12 DIAGNOSIS — Z23 Encounter for immunization: Secondary | ICD-10-CM | POA: Diagnosis not present

## 2021-08-04 ENCOUNTER — Ambulatory Visit: Payer: 59 | Admitting: Family Medicine

## 2021-08-04 ENCOUNTER — Ambulatory Visit (INDEPENDENT_AMBULATORY_CARE_PROVIDER_SITE_OTHER): Payer: 59 | Admitting: Family Medicine

## 2021-08-04 ENCOUNTER — Encounter: Payer: Self-pay | Admitting: Family Medicine

## 2021-08-04 DIAGNOSIS — K625 Hemorrhage of anus and rectum: Secondary | ICD-10-CM

## 2021-08-04 MED ORDER — HYDROCORTISONE ACETATE 25 MG RE SUPP: 25.0000 mg | Freq: Two times a day (BID) | RECTAL | 0 refills | Status: DC

## 2021-08-04 NOTE — Progress Notes (Signed)
Patient is here with concerns of rectal  bleeding. Patient said that after she has a bowel movement there is bright red blood. Patient denies straining or hard stools. Patient is very concern. ?

## 2021-08-04 NOTE — Progress Notes (Addendum)
? ? ? ?  SUBJECTIVE:  ? ?CHIEF COMPLAINT / HPI:  ? ?Kelly Morton is a 26 y.o. female presents for BRPR ? ?Bright red bleeding per rectum ?Pt reports intermittent leaking of bright red per rectum after BM. Has occurred 5 times. Approx 1 teaspoon each time. Last time she had bleeding was this morning. Denies constipation. No pain when wiping or having. It feels "swollen" and "bloated soft tissue afterwards. She has not felt any lumps.   ?No excessive use of NSAIDs/ASA/anticoagulants. Does vomit twice a week after h. Pylori dx. Denies dizziness, fevers, nausea,diarrhea, abdominal pain or melena. No hx of anal fissures. No FHx of colorectal cancer.  ?Flowsheet Row Office Visit from 08/04/2021 in [facility]  ?PHQ-9 Total Score 2  ? ?  ?  ?PERTINENT  PMH / PSH: H pylori, lactose intolerance, mitral regurg  ? ?OBJECTIVE:  ? ?BP 129/79   Pulse 72   Temp 98.6 ?F (37 ?C) (Oral)   Resp 16   Ht 5\' 8"  (1.727 m)   Wt 216 lb 3.2 oz (98.1 kg)   SpO2 98%   BMI 32.87 kg/m?   ? ?General: Alert, no acute distress ?Cardio: well perfused  ?Pulm: normal work of breathing ?Abdomen: Bowel sounds normal. Abdomen soft and non-tender.  ?Neuro: Cranial nerves grossly intact  ? ?Rectal exam chaperoned by CMA ?No hemorrhoids or anal fissures noted ?Normal anal tone ?No melena on glove ? ?ASSESSMENT/PLAN:  ? ?Rectal bleeding ?Unclear cause of rectal bleeding. Could be internal hemorrhoids given hx and feeling of "swelling" inside rectum. Pt denies hx of constipation. Considered anal fissure however normal rectal exam today and pt denies pt. Low suspicion for IBD or upper GI bleed, no melena. Provided trial of Anusol suppositories for pt to see if this helps. If no improvement in sx would consider referring to GI/general surgery. Return precautions provided to pt. ?  ? ?Danne Harbor, MD PGY-3 ?[facility]   ?

## 2021-08-04 NOTE — Patient Instructions (Signed)
Thank you for coming to see me today. It was a pleasure. Today we discussed your rectal bleeding. I do not think it is anything dangerous exam. Your rectal exam was normal. I recommend anusol suppositories. You may have some internal hemorrhoids. ?If symptoms do not improve, then follow up with PCP/go to ED.  ? ?If you have any questions or concerns, please do not hesitate to call the office at [phone]. ? ?Best wishes,  ? ?Dr Allena Katz   ?

## 2021-08-08 DIAGNOSIS — K625 Hemorrhage of anus and rectum: Secondary | ICD-10-CM | POA: Insufficient documentation

## 2021-08-08 NOTE — Assessment & Plan Note (Addendum)
Unclear cause of rectal bleeding. Could be internal hemorrhoids given hx and feeling of "swelling" inside rectum. Pt denies hx of constipation. Considered anal fissure however normal rectal exam today and pt denies pt. Low suspicion for IBD or upper GI bleed, no melena. Provided trial of Anusol suppositories for pt to see if this helps. If no improvement in sx would consider referring to GI/general surgery. Return precautions provided to pt. ?

## 2021-09-25 ENCOUNTER — Telehealth: Payer: Self-pay | Admitting: Internal Medicine

## 2021-09-25 NOTE — Telephone Encounter (Signed)
Spoke with pt and had syncopal episode this am at work and was out approx 45 sec to a minute Per pt feels weak ,shaky, hard to breath, prior to episode Also B/P was 140/95 usually runs 110/70 and HR 96 (not fast but  feels like beating out of chest)also noted a hot sensation Pt notes 1-2 episodes per month and on 08/21/21 had 4 episodes in 30 minutes Pt called due to recovery time is taking longer before would bounce back after 5 minutes Pt now states it is taking 30 plus minutes Pt has appt on 09-27-21 at 4:20 pm Discussed with Dr Gasper Sells and due to pt having appt in 2 days no time to get tests done prior .Per Dr Gasper Sells will evaluate pt in 2 days and will possibly order MRI and monitor at that time not convinced this heart related . Pt aware and will keep upcoming appt ./cy

## 2021-09-25 NOTE — Telephone Encounter (Signed)
Pt c/o Syncope: STAT if syncope occurred within 30 minutes and pt complains of lightheadedness High Priority if episode of passing out, completely, today or in last 24 hours   Did you pass out today? Yes    When is the last time you passed out? This morning   Has this occurred multiple times? Yes    Did you have any symptoms prior to passing out? Dizzy, lightheaded, fast HR, episode lasted around 30 min.

## 2021-09-26 ENCOUNTER — Encounter: Payer: Self-pay | Admitting: *Deleted

## 2021-09-27 ENCOUNTER — Ambulatory Visit: Payer: 59 | Admitting: Internal Medicine

## 2021-09-27 ENCOUNTER — Encounter: Payer: Self-pay | Admitting: Internal Medicine

## 2021-09-27 VITALS — BP 112/85 | HR 65 | Ht 68.0 in | Wt 222.0 lb

## 2021-09-27 DIAGNOSIS — I341 Nonrheumatic mitral (valve) prolapse: Secondary | ICD-10-CM | POA: Diagnosis not present

## 2021-09-27 DIAGNOSIS — R55 Syncope and collapse: Secondary | ICD-10-CM

## 2021-09-27 DIAGNOSIS — I340 Nonrheumatic mitral (valve) insufficiency: Secondary | ICD-10-CM

## 2021-09-27 NOTE — Patient Instructions (Signed)
Medication Instructions:   *If you need a refill on your cardiac medications before your next appointment, please call your pharmacy*   Lab Work: CBC for Cardiac MRI   If you have labs (blood work) drawn today and your tests are completely normal, you will receive your results only by: MyChart Message (if you have MyChart) OR A paper copy in the mail If you have any lab test that is abnormal or we need to change your treatment, we will call you to review the results.   Testing/Procedures:  For Dysautonomia International Mitral Annular Dysjunction  Your physician has requested that you have a cardiac MRI. Cardiac MRI uses a computer to create images of your heart as its beating, producing both still and moving pictures of your heart and major blood vessels. For further information please visit [email or web address]. Please follow the instruction sheet given to you today for more information.   Follow-Up: At [facility], you and your health needs are our priority.  As part of our continuing mission to provide you with exceptional heart care, we have created designated Provider Care Teams.  These Care Teams include your primary Cardiologist (physician) and Advanced Practice Providers (APPs -  Physician Assistants and Nurse Practitioners) who all work together to provide you with the care you need, when you need it.  We recommend signing up for the patient portal called "MyChart".  Sign up information is provided on this After Visit Summary.  MyChart is used to connect with patients for Virtual Visits (Telemedicine).  Patients are able to view lab/test results, encounter notes, upcoming appointments, etc.  Non-urgent messages can be sent to your provider as well.   To learn more about what you can do with MyChart, go to [email or web address].    Other Instructions   Important Information About Sugar

## 2021-09-27 NOTE — Progress Notes (Signed)
.  Cardiology Office Note:    Date:  09/27/2021   ID:  Kelly Morton, DOB 06/15/1995, MRN [id number]  PCP:  Lavonda Jumbo, DO  [facility] Cardiologist:  Christell Constant, MD  [facility] Electrophysiologist:  None   CC: Follow up Syncope  History of Present Illness:    Kelly Morton is a 26 y.o. female with a hx of syncope, PTSD, chronic migraine, and hx of IDA last seen 01/01/20.  Possible MAD on echo.  Was doing well.   2023 two repeat near syncopal episodes.  Fiance is tele conference-d in.  Patient notes that she was in the bathroom and felt dizzy. Called one of her co-workers then her fiance.  Passed out in the restroom sitting on the floor, woke up on the floor . Afterwards felt fatigue and weak for all day.  The month before that had four spells in thirty minutes since then.  Needed several naps but otherwise felt fine. No chang in heart rhythm since then.    She usually has episodes of fainting once a month, but worse with heat.  There are no interval hospital/ED visit.    No chest pain or pressure .  No SOB/DOE and no PND/Orthopnea.  No weight gain or leg swelling.  No palpitations or syncope .   Past Medical History:  Diagnosis Date   Ovarian cyst    PTSD (post-traumatic stress disorder)    Syncope     Past Surgical History:  Procedure Laterality Date   OOPHORECTOMY     TONSILLECTOMY      Current Medications: Current Meds  Medication Sig   acetaminophen (TYLENOL) 325 MG tablet Take 650 mg by mouth every 6 (six) hours as needed for mild pain.   aspirin-acetaminophen-caffeine (EXCEDRIN MIGRAINE) 250-250-65 MG tablet Take 3 tablets by mouth every 6 (six) hours as needed for headache or migraine.    escitalopram (LEXAPRO) 10 MG tablet Take 15 mg by mouth daily. Take 1.5 tabs daily   fluticasone (FLONASE) 50 MCG/ACT nasal spray as needed for congestion.   hydrocortisone (ANUSOL-HC) 25 MG suppository Place 1 suppository (25 mg total)  rectally 2 (two) times daily. (Patient taking differently: Place 25 mg rectally as needed for hemorrhoids or anal itching.)   hydrOXYzine (ATARAX) 10 MG tablet hydroxyzine HCl 10 mg tablet  TAKE 1/2 TO 2 TABLETS AS NEEDED BY MOUTH UP TO TWICE DAILY FOR ANXIETY   ibuprofen (ADVIL) 600 MG tablet Take 600 mg by mouth every 6 (six) hours as needed for moderate pain or cramping.   norethindrone (MICRONOR) 0.35 MG tablet Take 1 tablet by mouth daily.   ondansetron (ZOFRAN ODT) 4 MG disintegrating tablet Take 1 tablet (4 mg total) by mouth every 8 (eight) hours as needed for nausea or vomiting.     Allergies:   Animator, Bactrim [sulfamethoxazole-trimethoprim], and Elemental sulfur   Social History   Socioeconomic History   Marital status: Significant Other    Spouse name: Not on file   Number of children: Not on file   Years of education: Not on file   Highest education level: Not on file  Occupational History   Not on file  Tobacco Use   Smoking status: Never   Smokeless tobacco: Never  Substance and Sexual Activity   Alcohol use: Yes    Alcohol/week: 6.0 standard drinks    Types: 6 Glasses of wine per week   Drug use: Never   Sexual activity: Not on file  Other Topics Concern  Not on file  Social History Narrative   Not on file   Social Determinants of Health   Financial Resource Strain: Not on file  Food Insecurity: Not on file  Transportation Needs: Not on file  Physical Activity: Not on file  Stress: Not on file  Social Connections: Not on file   Social:  Saw patient with her fiance in previous visit; she is finished for counseling and is pending her exam and lifts weights, worked at [employer]  Family History: The patient's family history includes Cervical cancer in her maternal grandmother; Diabetes in her paternal grandmother; Hypertension in her father and mother; Lymphoma in her maternal grandfather; Prostate cancer in her maternal grandfather; Stroke in her mother.   Grandmother has arrhythmia lived to 3893; no SCD, drowning's car accidents.  ROS:   Please see the history of present illness.    All other systems reviewed and are negative.  EKGs/Labs/Other Studies Reviewed:    The following studies were reviewed today:  EKG: 09/27/21:  SR  07/01/20: Sinus Rhythm 81 short PR  Cardiac Event Monitoring: Date: 01/14/20 Results: Avg HR 79, min 44 bpm max 171 bpm Predominant rhythm: Sinus Rhythm 1 3s Pause occurred Isolated PVCs < 1.0% Isolated PACs < 1.0% Diary Events  associated with sinus rhythm Triggered Events Associated with sinus rhythm or sinus tachycardia   Transthoracic Echocardiogram: Date: 12/17/19 Results:  subtle PLAX evidence of Mitral Annular Disjunction, disjunction distance < 10 mm  1. Mild bileaflet mitral valve prolapse with mild mitral annular  disjunction. Displacement distance 6 mm. The mitral valve is myxomatous.  Mild mitral valve regurgitation. No evidence of mitral stenosis.   2. Left ventricular ejection fraction, by estimation, is 60 to 65%. The  left ventricle has normal function. The left ventricle has no regional  wall motion abnormalities. Left ventricular diastolic parameters were  normal. The average left ventricular  global longitudinal strain is -20.4 %. The global longitudinal strain is  normal.   3. Right ventricular systolic function is normal. The right ventricular  size is normal.   4. The aortic valve is tricuspid. Aortic valve regurgitation is not  visualized. No aortic stenosis is present.   Recent Labs: 02/20/2021: ALT 13; BUN 13; Creatinine, Ser 0.71; Potassium 4.5; Sodium 144  Recent Lipid Panel No results found for: CHOL, TRIG, HDL, CHOLHDL, VLDL, LDLCALC, LDLDIRECT    Physical Exam:    VS:  BP 112/85   Pulse 65   Ht 5\' 8"  (1.727 m)   Wt 222 lb (100.7 kg)   SpO2 98%   BMI 33.75 kg/m     Wt Readings from Last 3 Encounters:  09/27/21 222 lb (100.7 kg)  08/04/21 216 lb 3.2 oz (98.1 kg)   02/20/21 197 lb 3.2 oz (89.4 kg)    Orthostatic Vitals: Supine:  BP 110/74  HR 66 Sitting:  BP 114/80  HR 68 Standing:  BP 117/89  HR 71 Prolonged Stand:  BP 113/69  HR 75  GEN:  Well nourished, well developed in no acute distress HEENT: Normal NECK: No JVD LYMPHATICS: No lymphadenopathy CARDIAC: RRR, no murmurs, rubs, gallops RESPIRATORY:  Clear to auscultation without rales, wheezing or rhonchi  ABDOMEN: Soft, non-tender, non-distended MUSCULOSKELETAL:  No edema; No deformity  SKIN: Warm and dry  NEUROLOGIC:  Alert and oriented x 3 PSYCHIATRIC:  Normal affect   ASSESSMENT:    1. Syncope and collapse   2. Mild mitral regurgitation   3. Mitral valve prolapse   4. Vasovagal syncope  PLAN:    Near syncope Query of Mitral Annular Disjunction Vasovagal syncope - stress the importance of salt and water intake - gave education on slow rise, Valsalva maneuver exacerbation, temperature change - discussed muscle contraction and leg crossing - discussed elevation to 30-45 degrees when sleeping - discussed the benefit of exercise: Isometric exercises; transitioning slowly with your body, - will get CMR for MAD eval; if positive will repeat heart monitor (had skin rection with last monitor in 2021) - if not we will try compression stockings and dysautomnia exercises:  [email or web address]  Fall f/u with me   Medication Adjustments/Labs and Tests Ordered: Current medicines are reviewed at length with the patient today.  Concerns regarding medicines are outlined above.  Orders Placed This Encounter  Procedures   MR CARDIAC MORPHOLOGY W WO CONTRAST   CBC   EKG 12-Lead   No orders of the defined types were placed in this encounter.   Patient Instructions  Medication Instructions:   *If you need a refill on your cardiac medications before your next appointment, please call your  pharmacy*   Lab Work: CBC for Cardiac MRI   If you have labs (blood work) drawn today and your tests are completely normal, you will receive your results only by: MyChart Message (if you have MyChart) OR A paper copy in the mail If you have any lab test that is abnormal or we need to change your treatment, we will call you to review the results.   Testing/Procedures:  For Dysautonomia International Mitral Annular Dysjunction  Your physician has requested that you have a cardiac MRI. Cardiac MRI uses a computer to create images of your heart as its beating, producing both still and moving pictures of your heart and major blood vessels. For further information please visit [email or web address]. Please follow the instruction sheet given to you today for more information.   Follow-Up: At [facility], you and your health needs are our priority.  As part of our continuing mission to provide you with exceptional heart care, we have created designated Provider Care Teams.  These Care Teams include your primary Cardiologist (physician) and Advanced Practice Providers (APPs -  Physician Assistants and Nurse Practitioners) who all work together to provide you with the care you need, when you need it.  We recommend signing up for the patient portal called "MyChart".  Sign up information is provided on this After Visit Summary.  MyChart is used to connect with patients for Virtual Visits (Telemedicine).  Patients are able to view lab/test results, encounter notes, upcoming appointments, etc.  Non-urgent messages can be sent to your provider as well.   To learn more about what you can do with MyChart, go to [email or web address].    Other Instructions   Important Information About Sugar         Signed, Christell Constant, MD  09/27/2021 5:18 PM    [facility]

## 2021-09-29 ENCOUNTER — Telehealth: Payer: Self-pay

## 2021-09-29 MED ORDER — DIAZEPAM 5 MG PO TABS: ORAL_TABLET | ORAL | 0 refills | Status: DC

## 2021-09-29 NOTE — Telephone Encounter (Signed)
Anti-anxiety med called in okay per Dr Izora Ribas for the pts upcoming Cardiac MRI. She reports problems with being claustrophobic and anxiety associated with having the MRI.

## 2021-10-05 ENCOUNTER — Telehealth: Payer: Self-pay | Admitting: Internal Medicine

## 2021-10-05 NOTE — Telephone Encounter (Signed)
FMLA forms came through the fax, I Nicky Pugh [place]) called pt and left VM to inform her that we will need her payment and more information from her before the provider will complete the forms

## 2021-10-11 DIAGNOSIS — Z0279 Encounter for issue of other medical certificate: Secondary | ICD-10-CM

## 2021-10-11 NOTE — Telephone Encounter (Signed)
Patient paid for form completion. Form placed in Dr. Debby Bud box.

## 2021-10-19 NOTE — Telephone Encounter (Signed)
Paperwork completed by MD and placed at front desk to be faxed as requested.

## 2021-10-20 NOTE — Telephone Encounter (Signed)
FMLA documents received back from doctor and faxed to [employer].

## 2021-11-21 ENCOUNTER — Encounter: Payer: Self-pay | Admitting: Family Medicine

## 2021-11-27 ENCOUNTER — Ambulatory Visit: Payer: 59

## 2021-11-27 NOTE — Progress Notes (Unsigned)
    SUBJECTIVE:   CHIEF COMPLAINT / HPI:   Knee Pain In 2016 she says that she popped out her knee slightly. Since then***. No knee imaging before  PERTINENT  PMH / PSH: ***  OBJECTIVE:   There were no vitals taken for this visit.  ***  ASSESSMENT/PLAN:   No problem-specific Assessment & Plan notes found for this encounter.     Levin Erp, MD [facility]

## 2021-11-28 ENCOUNTER — Ambulatory Visit (INDEPENDENT_AMBULATORY_CARE_PROVIDER_SITE_OTHER): Payer: 59 | Admitting: Student

## 2021-11-28 VITALS — BP 120/83 | HR 77 | Ht 68.0 in | Wt 219.0 lb

## 2021-11-28 DIAGNOSIS — M25562 Pain in left knee: Secondary | ICD-10-CM | POA: Diagnosis not present

## 2021-11-28 NOTE — Patient Instructions (Signed)
It was great to see you! Thank you for allowing me to participate in your care!   I recommend that you always bring your medications to each appointment as this makes it easy to ensure we are on the correct medications and helps [place] not miss when refills are needed.  Our plans for today:  -I have referred you to sports medicine to obtain ultrasound/therapies to help -I have attached RICE therapy for injuries in the meantime, you may take ibuprofen/Tylenol as needed  Take care and seek immediate care sooner if you develop any concerns. Please remember to show up 15 minutes before your scheduled appointment time!  Kelly Erp, MD [facility]

## 2021-11-28 NOTE — Assessment & Plan Note (Signed)
Patient has left medial knee pain on examination.  No hyperlaxity and valgus and varus testing or any history of direct trauma to suggest MCL issue.  Could consider medial meniscus injury also given history of squatting incident in 2016.  Has been improving in the past 3 days and is weightbearing well.  Does not meet criteria for knee x-ray currently.  Patient amenable to sports medicine referral.  Advised RICE therapy in meantime.

## 2021-11-29 IMAGING — MR MR HEAD WO/W CM
14 of 16 series · 40 of 48 positions shown · IV contrast (gadavist)
Comparison: Head CT from 3 days ago

CLINICAL DATA: Simple syncope with normal neuro exam. Syncope
during orthostatics assessment.

EXAM:
MRI HEAD WITHOUT AND WITH CONTRAST
TECHNIQUE: Multiplanar, multiecho pulse sequences of the brain and surrounding
structures were obtained without and with intravenous contrast.
CONTRAST:  7mL GADAVIST GADOBUTROL 1 MMOL/ML IV SOLN

[Series 5: DWI · axial · 3.0mm · 0.88mm/px · z∈[-57,+83]mm · 7 of 96 slices shown (1 of 4)]
[im 1/96]
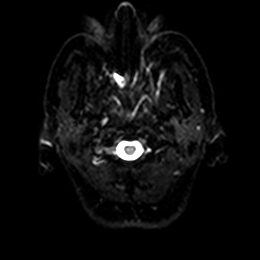
[im 16/96]
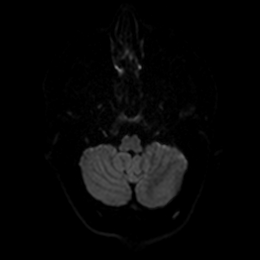
[im 32/96]
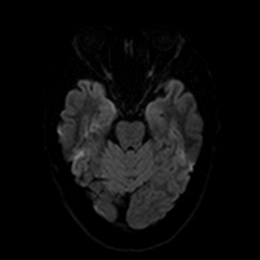
[im 48/96]
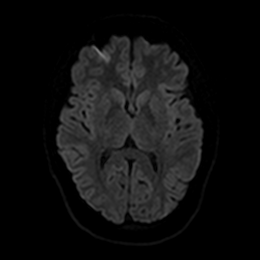
[im 64/96]
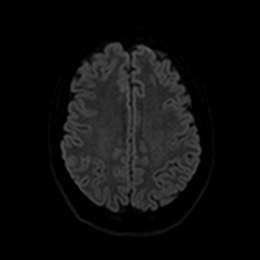
[im 80/96]
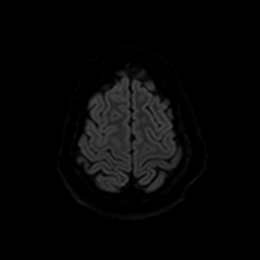
[im 96/96]
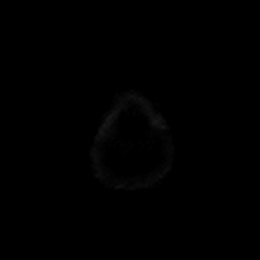

[Series 6: DWI · axial · 3.0mm · 0.88mm/px · z∈[-57,+83]mm · 3 of 48 slices shown (2 of 4)]
[im 1/48]
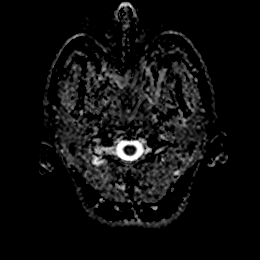
[im 24/48]
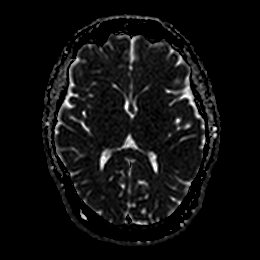
[im 48/48]
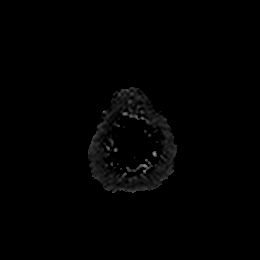

[Series 7: DWI · coronal · 4.0mm · 0.88mm/px · 5 of 66 slices shown (3 of 4)]
[im 1/66]
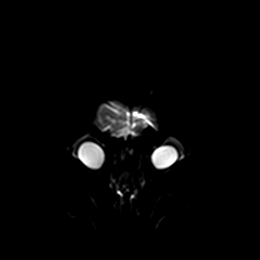
[im 17/66]
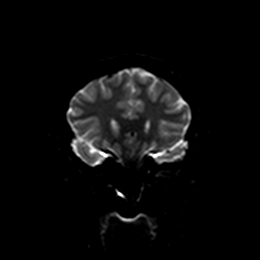
[im 33/66]
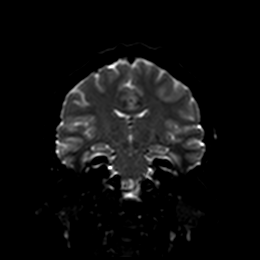
[im 49/66]
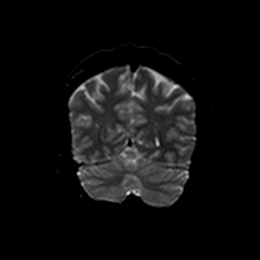
[im 66/66]
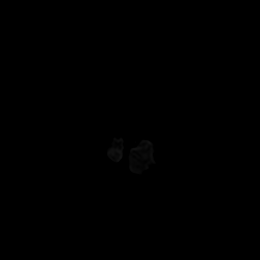

[Series 8: DWI · coronal · 4.0mm · 0.88mm/px · 2 of 33 slices shown (4 of 4)]
[im 1/33]
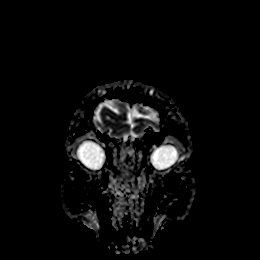
[im 33/33]
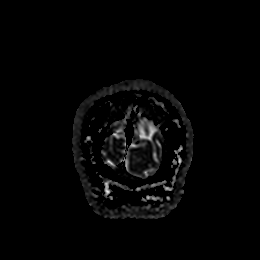

[Series 9: T1 · sagittal · 5.0mm · 0.75mm/px · 2 of 23 slices shown]
[im 1/23]
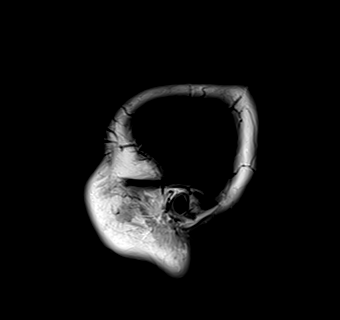
[im 23/23]
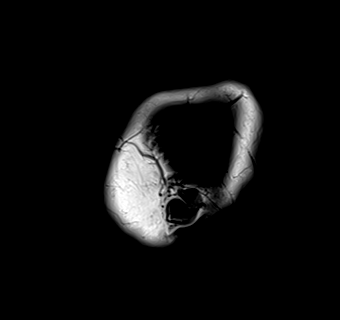

[Series 10: T2 · axial · 5.0mm · 0.72mm/px · z∈[-65,+91]mm · 2 of 27 slices shown (1 of 2)]
[im 1/27]
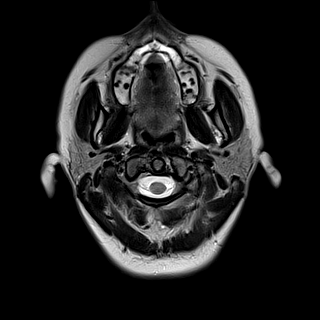
[im 27/27]
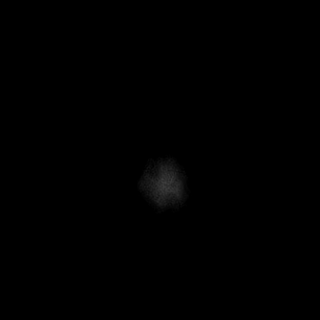

[Series 11: FLAIR · axial · 5.0mm · 0.45mm/px · z∈[-65,+91]mm · 2 of 27 slices shown (1 of 2)]
[im 1/27]
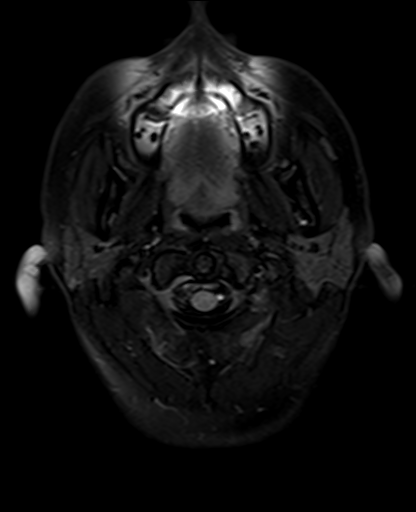
[im 27/27]
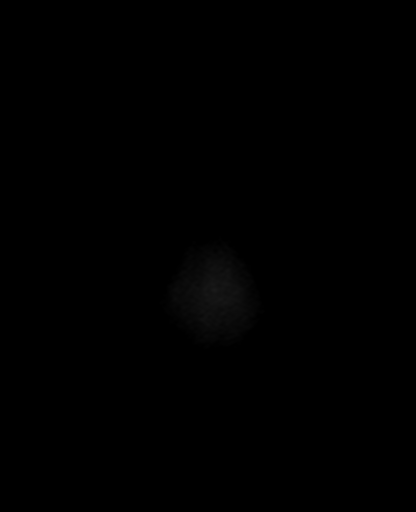

[Series 13: pha_images · axial · 3.0mm · 0.90mm/px · z∈[-72,+99]mm · 4 of 58 slices shown]
[im 1/58]
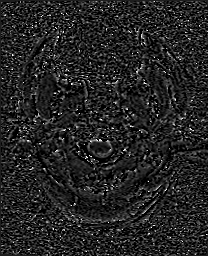
[im 20/58]
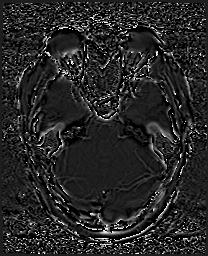
[im 39/58]
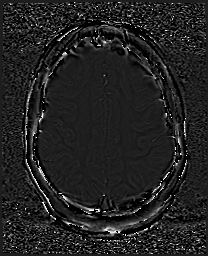
[im 58/58]
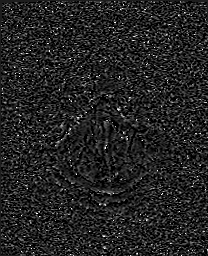

[Series 14: swi_images · axial · 3.0mm · 0.90mm/px · z∈[-75,+102]mm · 4 of 60 slices shown]
[im 1/60]
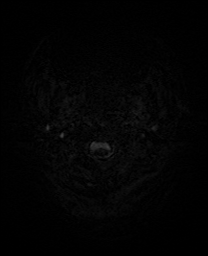
[im 20/60]
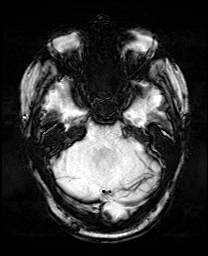
[im 40/60]
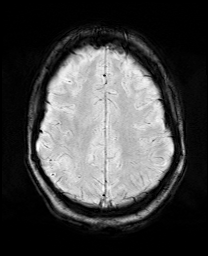
[im 60/60]
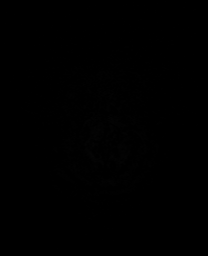

[Series 17: T2 · coronal · 3.0mm · 0.27mm/px · 2 of 32 slices shown (2 of 2)]
[im 1/32]
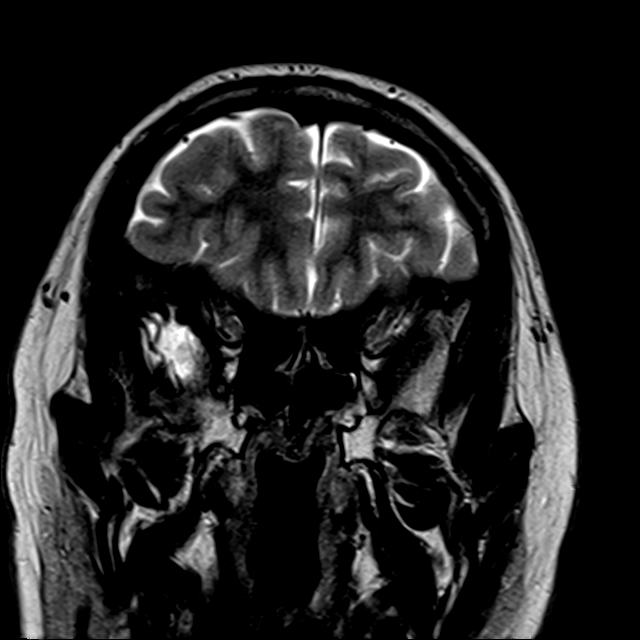
[im 32/32]
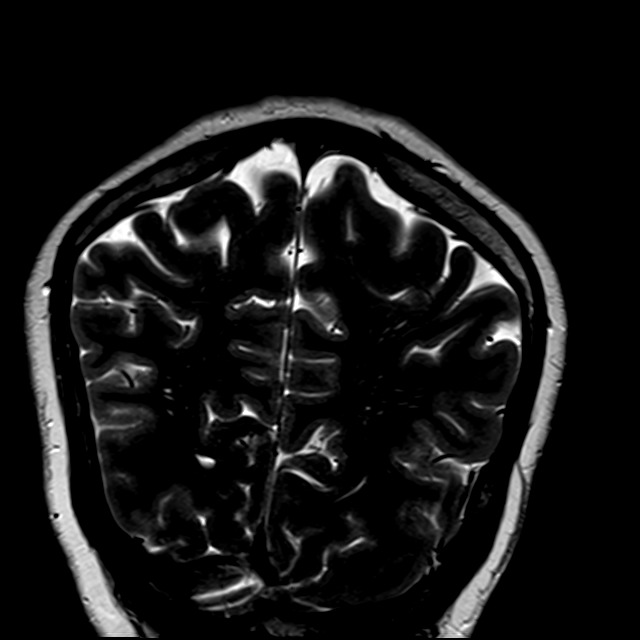

[Series 18: FLAIR · coronal · 3.0mm · 0.56mm/px · 1 of 21 slices shown (2 of 2)]
[im 1/21]
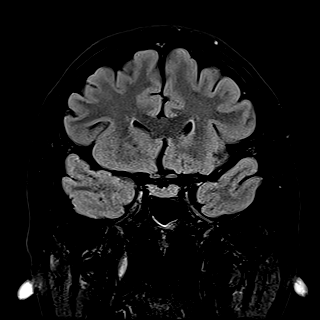

[Series 19: T2 post-contrast · coronal · 5.0mm · 0.72mm/px · 2 of 28 slices shown]
[im 1/28]
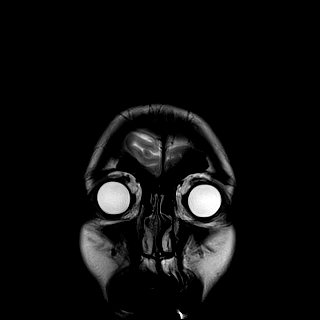
[im 28/28]
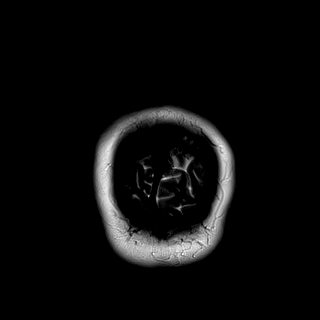

[Series 21: T1 post-contrast · coronal · 5.0mm · 0.34mm/px · 2 of 28 slices shown (1 of 2)]
[im 1/28]
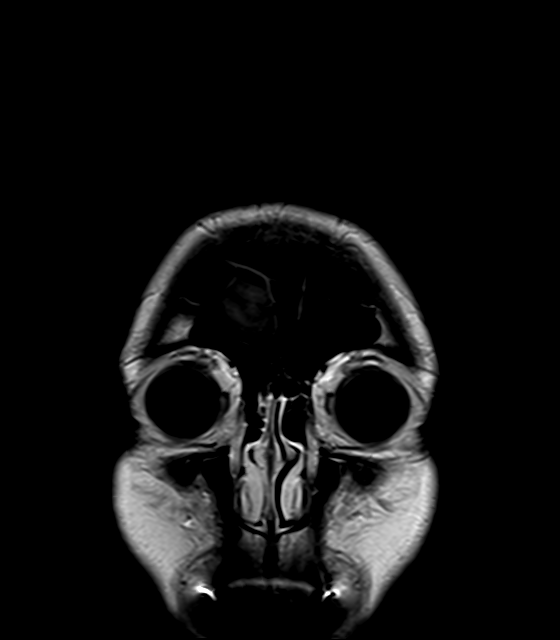
[im 28/28]
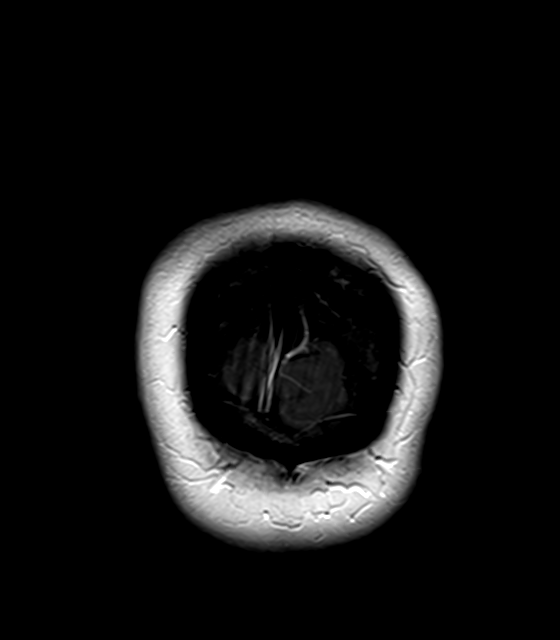

[Series 22: T1 post-contrast · sagittal · 5.0mm · 0.72mm/px · 2 of 23 slices shown (2 of 2)]
[im 1/23]
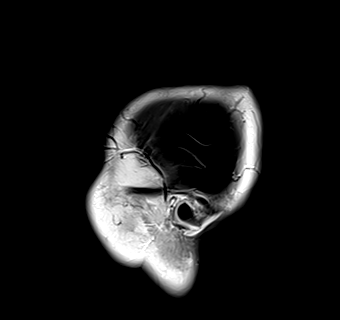
[im 23/23]
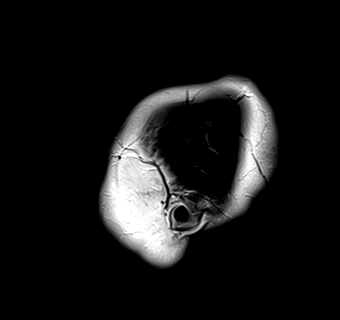

[40 of 48 positions shown; findings below may reference images not displayed]

FINDINGS: Brain: No infarction, hemorrhage, hydrocephalus, extra-axial
collection or mass lesion. No white matter disease or atrophy. No
abnormal intracranial enhancement.

Vascular: Normal flow voids and vascular enhancements

Skull and upper cervical spine: Normal marrow signal

Sinuses/Orbits: Negative
IMPRESSION: Normal brain MRI.

## 2021-12-08 ENCOUNTER — Telehealth: Payer: Self-pay | Admitting: Internal Medicine

## 2021-12-08 ENCOUNTER — Other Ambulatory Visit: Payer: Self-pay

## 2021-12-08 DIAGNOSIS — I340 Nonrheumatic mitral (valve) insufficiency: Secondary | ICD-10-CM

## 2021-12-08 DIAGNOSIS — R55 Syncope and collapse: Secondary | ICD-10-CM

## 2021-12-08 NOTE — Telephone Encounter (Signed)
Reports needs a CBC for Cardiac MRI scheduled for 12/15/21.  Order placed.

## 2021-12-08 NOTE — Telephone Encounter (Signed)
Pt states that [facility] is requesting CBC lab orders be placed for the pt.

## 2021-12-08 NOTE — Progress Notes (Signed)
Order placed for CBC pre Cardiac MRI.

## 2021-12-12 ENCOUNTER — Encounter (HOSPITAL_COMMUNITY): Payer: Self-pay

## 2021-12-12 ENCOUNTER — Other Ambulatory Visit: Payer: Self-pay

## 2021-12-12 ENCOUNTER — Emergency Department (HOSPITAL_COMMUNITY)
Admission: EM | Admit: 2021-12-12 | Discharge: 2021-12-12 | Disposition: A | Discharge status: Home / Self Care | Payer: 59 | Attending: Emergency Medicine | Admitting: Emergency Medicine

## 2021-12-12 DIAGNOSIS — R55 Syncope and collapse: Secondary | ICD-10-CM | POA: Diagnosis not present

## 2021-12-12 LAB — CBC WITH DIFFERENTIAL/PLATELET
Abs Immature Granulocytes: 0.02 10*3/uL (ref 0.00–0.07)
Basophils Absolute: 0 10*3/uL (ref 0.0–0.1)
Basophils Relative: 0 %
Eosinophils Absolute: 0.1 10*3/uL (ref 0.0–0.5)
Eosinophils Relative: 2 %
HCT: 42.5 % (ref 36.0–46.0)
Hemoglobin: 14 g/dL (ref 12.0–15.0)
Immature Granulocytes: 0 %
Lymphocytes Relative: 22 %
Lymphs Abs: 1.4 10*3/uL (ref 0.7–4.0)
MCH: 29.9 pg (ref 26.0–34.0)
MCHC: 32.9 g/dL (ref 30.0–36.0)
MCV: 90.8 fL (ref 80.0–100.0)
Monocytes Absolute: 0.5 10*3/uL (ref 0.1–1.0)
Monocytes Relative: 8 %
Neutro Abs: 4.4 10*3/uL (ref 1.7–7.7)
Neutrophils Relative %: 68 %
Platelets: 280 10*3/uL (ref 150–400)
RBC: 4.68 MIL/uL (ref 3.87–5.11)
RDW: 14.3 % (ref 11.5–15.5)
WBC: 6.4 10*3/uL (ref 4.0–10.5)
nRBC: 0 % (ref 0.0–0.2)

## 2021-12-12 LAB — BASIC METABOLIC PANEL
Anion gap: 6 (ref 5–15)
BUN: 10 mg/dL (ref 6–20)
CO2: 26 mmol/L (ref 22–32)
Calcium: 9.1 mg/dL (ref 8.9–10.3)
Chloride: 106 mmol/L (ref 98–111)
Creatinine, Ser: 0.78 mg/dL (ref 0.44–1.00)
GFR, Estimated: >60 mL/min (ref >60)
Glucose, Bld: 102 mg/dL — ABNORMAL HIGH (ref 70–99)
Potassium: 4.1 mmol/L (ref 3.5–5.1)
Sodium: 138 mmol/L (ref 135–145)

## 2021-12-12 LAB — HCG, QUANTITATIVE, PREGNANCY: hCG, Beta Chain, Quant, S: <1 m[IU]/mL (ref <5)

## 2021-12-12 LAB — CBG MONITORING, ED: Glucose-Capillary: 98 mg/dL (ref 70–99)

## 2021-12-12 MED ORDER — SODIUM CHLORIDE 0.9 % IV BOLUS
500.0000 mL | Freq: Once | INTRAVENOUS | Status: AC
  Administered 2021-12-12: 500 mL via INTRAVENOUS

## 2021-12-12 NOTE — ED Triage Notes (Addendum)
Pt an employee of [employer]. States she had 3 syncopal episodes this morning. Also states she has Fainting disorder and has been seen for same prior. Pt also states she has a premonition of event coming on and can get herself to a safe position during event, having auditory awareness but no visual or physical response during syncopal events. Pt arrives A&O X 4 and in no distress. Denies fall or injury.

## 2021-12-12 NOTE — Discharge Instructions (Signed)
Return for any problem.  ?

## 2021-12-12 NOTE — ED Provider Notes (Signed)
[facility]-EMERGENCY DEPT Provider Note   CSN: [id number] Arrival date & time: 12/12/21  2671     History  Chief Complaint  Patient presents with   Loss of Consciousness    Kelly Morton is a 26 y.o. female.  26 year old female with prior medical history as detailed below presents for evaluation.  Patient was longstanding history of syncopal episodes.  Patient reports that this morning she had significant emotional response to the news that her mother may have cancer.  She reports that she started to hyperventilate and then had a brief period of near syncope.  She reports that she feels improved now.  She reports multiple prior episodes of seizure in the past.  Patient is followed by cardiology for evaluation of same.  She denies associated chest pain, shortness of breath, palpitations, nausea, vomiting.  Patient reports that she felt "tingly all over" prior to her near syncopal event.   Patient with history of multiple episodes of syncope -seen by cardiology for same since 2021.  Other medical issues include PTSD, chronic migraines, eczema, and possible mitral annular dysfunction on echo.     The history is provided by the patient and medical records.  Loss of Consciousness Episode history:  Single Most recent episode:  Today Timing:  Sporadic Progression:  Resolved Chronicity:  Recurrent      Home Medications Prior to Admission medications   Medication Sig Start Date End Date Taking? Authorizing Provider  acetaminophen (TYLENOL) 325 MG tablet Take 650 mg by mouth every 6 (six) hours as needed for mild pain.    [provider]  aspirin-acetaminophen-caffeine (EXCEDRIN MIGRAINE) [phone] MG tablet Take 3 tablets by mouth every 6 (six) hours as needed for headache or migraine.     [provider]  diazepam (VALIUM) 5 MG tablet Take one tablet (5 mg) by mouth 30 minutes prior to your upcoming Cardiac MRI. 09/29/21   Riley Lam A, MD  escitalopram (LEXAPRO) 10 MG tablet Take 15 mg by mouth daily. Take 1.5 tabs daily 12/22/19   [provider]  fluticasone (FLONASE) 50 MCG/ACT nasal spray as needed for congestion.    [provider]  hydrocortisone (ANUSOL-HC) 25 MG suppository Place 1 suppository (25 mg total) rectally 2 (two) times daily. Patient taking differently: Place 25 mg rectally as needed for hemorrhoids or anal itching. 08/04/21   Towanda Octave, MD  hydrOXYzine (ATARAX) 10 MG tablet hydroxyzine HCl 10 mg tablet  TAKE 1/2 TO 2 TABLETS AS NEEDED BY MOUTH UP TO TWICE DAILY FOR ANXIETY    [provider]  ibuprofen (ADVIL) 600 MG tablet Take 600 mg by mouth every 6 (six) hours as needed for moderate pain or cramping.    [provider]  norethindrone (MICRONOR) 0.35 MG tablet Take 1 tablet by mouth daily. 08/11/21   [provider]      Allergies    Fire ant, Bactrim [sulfamethoxazole-trimethoprim], and Elemental sulfur    Review of Systems   Review of Systems  Cardiovascular:  Positive for syncope.  All other systems reviewed and are negative.   Physical Exam Updated Vital Signs BP 124/85 (BP Location: Right Arm)   Pulse 70   Temp 98 F (36.7 C) (Oral)   Resp 13   LMP 11/11/2021   SpO2 97%  Physical Exam Vitals and nursing note reviewed.  Constitutional:      General: She is not in acute distress.    Appearance: Normal appearance. She is well-developed.  HENT:  Head: Normocephalic and atraumatic.  Eyes:     Conjunctiva/sclera: Conjunctivae normal.     Pupils: Pupils are equal, round, and reactive to light.  Cardiovascular:     Rate and Rhythm: Normal rate and regular rhythm.     Heart sounds: Normal heart sounds.  Pulmonary:     Effort: Pulmonary effort is normal. No respiratory distress.     Breath sounds: Normal breath sounds.  Abdominal:     General: There is no distension.     Palpations: Abdomen is soft.     Tenderness: There is  no abdominal tenderness.  Musculoskeletal:        General: No deformity. Normal range of motion.     Cervical back: Normal range of motion and neck supple.  Skin:    General: Skin is warm and dry.  Neurological:     General: No focal deficit present.     Mental Status: She is alert and oriented to person, place, and time.     ED Results / Procedures / Treatments   Labs (all labs ordered are listed, but only abnormal results are displayed) Labs Reviewed  BASIC METABOLIC PANEL  CBC WITH DIFFERENTIAL/PLATELET  CBG MONITORING, ED  I-STAT BETA HCG BLOOD, ED ([facility], [facility], [facility] ONLY)    EKG None  Radiology No results found.  Procedures Procedures    Medications Ordered in ED Medications  sodium chloride 0.9 % bolus 500 mL (has no administration in time range)    ED Course/ Medical Decision Making/ A&P                           Medical Decision Making Amount and/or Complexity of Data Reviewed Labs: ordered.    Medical Screen Complete  This patient presented to the ED with complaint of syncope.  This complaint involves an extensive number of treatment options. The initial differential diagnosis includes, but is not limited to, metabolic abnormality, dehydration, vasovagal syncope, hyperventilation syndrome, etc  This presentation is: Acute, Chronic, Self-Limited, Previously Undiagnosed, Uncertain Prognosis, Complicated, Systemic Symptoms, and Threat to Life/Bodily Function  Patient with longstanding history of syncope presents with recurrent episode of syncope.  Patient's chair this morning was emotional response to news that her mother had cancer.  She reports feeling anxious and upset.  She admits to hyperventilation leading to syncopal event this AM.  Upon evaluation she is without complaint.  She feels improved.  Screening labs obtained are without significant abnormality.  After ED evaluation the patient feels improved and desires DC home.  Importance of close  follow-up stressed.  Strict return precautions given and understood.    Patient is followed by cardiology.  She has additional work-up scheduled by cardiology for her reported episodes of syncope.  Additional history obtained:  External records from outside sources obtained and reviewed including prior ED visits and prior Inpatient records.    Lab Tests:  I ordered and personally interpreted labs.  The pertinent results include:  cbc cmp hcg  Cardiac Monitoring:  The patient was maintained on a cardiac monitor.  I personally viewed and interpreted the cardiac monitor which showed an underlying rhythm of: nsr   Problem List / ED Course:  Syncope    Reevaluation:  After the interventions noted above, I reevaluated the patient and found that they have: improved  Disposition:  After consideration of the diagnostic results and the patients response to treatment, I feel that the patent would benefit from close outpatient followup.  Final Clinical Impression(s) / ED Diagnoses Final diagnoses:  Syncope and collapse    Rx / DC Orders ED Discharge Orders     None         Wynetta Fines, MD 12/12/21 1014

## 2021-12-13 ENCOUNTER — Telehealth: Payer: Self-pay | Admitting: Internal Medicine

## 2021-12-13 NOTE — Telephone Encounter (Signed)
Patient went to the ER (ask that our office review the notes), while there she got labs done. Calling to see if she still need to get labs done for our office. Please advise

## 2021-12-13 NOTE — Telephone Encounter (Signed)
Left a message advising pt that labs that Dr. Izora Ribas needs were drawn during ED visit.  Advised to call our office with any questions or concerns.

## 2021-12-14 ENCOUNTER — Telehealth (HOSPITAL_COMMUNITY): Payer: Self-pay | Admitting: *Deleted

## 2021-12-14 ENCOUNTER — Other Ambulatory Visit (HOSPITAL_COMMUNITY): Payer: Self-pay

## 2021-12-14 ENCOUNTER — Telehealth: Payer: Self-pay | Admitting: Internal Medicine

## 2021-12-14 MED ORDER — LORAZEPAM 1 MG PO TABS
1.0000 mg | ORAL_TABLET | ORAL | 0 refills | Status: DC
  Filled 2021-12-14: qty 1, 1d supply, fill #0

## 2021-12-14 NOTE — Telephone Encounter (Signed)
Attempted to call patient regarding upcoming cardiac MRI appointment. Left message on voicemail with name and callback number  Shala Baumbach RN Navigator Cardiac Imaging [facility] [phone] Office [phone] Cell  

## 2021-12-14 NOTE — Telephone Encounter (Signed)
Patient returning call regarding upcoming cardiac imaging study; pt verbalizes understanding of appt date/time, parking situation and where to check in, status and medications ordered, and verified current allergies; name and call back number provided for further questions should they arise  Larey Brick RN Navigator Cardiac Imaging [facility] [phone] office [phone] cell  Patient states she has a ride and will take the Valium that was sent to her pharmacy. She denies any metal.

## 2021-12-14 NOTE — Telephone Encounter (Signed)
Pt c/o medication issue:  1. Name of Medication:   diazepam (VALIUM) 5 MG tablet  2. How are you currently taking this medication (dosage and times per day)?  N/A  3. Are you having a reaction (difficulty breathing--STAT)? N/A  4. What is your medication issue?    Patient called stating this medication was supposed to be sent to the [facility] to be taken prior to her procedure tomorrow and the prescription was not yet available at the pharmacy.

## 2021-12-14 NOTE — Telephone Encounter (Signed)
Called pt advised MD ordered Ativan 1 mg 30 min prior to Cmri.  Called medication into [facility].

## 2021-12-15 ENCOUNTER — Ambulatory Visit (HOSPITAL_COMMUNITY)
Admission: RE | Admit: 2021-12-15 | Discharge: 2021-12-15 | Disposition: A | Discharge status: Home / Self Care | Payer: 59 | Source: Ambulatory Visit | Attending: Internal Medicine | Admitting: Internal Medicine

## 2021-12-15 ENCOUNTER — Other Ambulatory Visit: Payer: Self-pay | Admitting: Internal Medicine

## 2021-12-15 ENCOUNTER — Telehealth: Payer: Self-pay | Admitting: Internal Medicine

## 2021-12-15 ENCOUNTER — Ambulatory Visit
Admission: RE | Admit: 2021-12-15 | Discharge: 2021-12-15 | Disposition: A | Discharge status: Home / Self Care | Payer: 59 | Source: Ambulatory Visit | Attending: Family Medicine | Admitting: Family Medicine

## 2021-12-15 ENCOUNTER — Ambulatory Visit (INDEPENDENT_AMBULATORY_CARE_PROVIDER_SITE_OTHER): Payer: 59 | Admitting: Family Medicine

## 2021-12-15 VITALS — BP 120/82 | Ht 68.0 in | Wt 215.0 lb

## 2021-12-15 DIAGNOSIS — I340 Nonrheumatic mitral (valve) insufficiency: Secondary | ICD-10-CM

## 2021-12-15 DIAGNOSIS — I341 Nonrheumatic mitral (valve) prolapse: Secondary | ICD-10-CM | POA: Insufficient documentation

## 2021-12-15 DIAGNOSIS — S838X2A Sprain of other specified parts of left knee, initial encounter: Secondary | ICD-10-CM

## 2021-12-15 DIAGNOSIS — M25562 Pain in left knee: Secondary | ICD-10-CM

## 2021-12-15 DIAGNOSIS — R55 Syncope and collapse: Secondary | ICD-10-CM

## 2021-12-15 DIAGNOSIS — Z0279 Encounter for issue of other medical certificate: Secondary | ICD-10-CM

## 2021-12-15 MED ORDER — GADOBUTROL 1 MMOL/ML IV SOLN
12.0000 mL | Freq: Once | INTRAVENOUS | Status: AC | PRN
  Administered 2021-12-15: 12 mL via INTRAVENOUS

## 2021-12-15 NOTE — Telephone Encounter (Signed)
Pt had labs drawn in the ED on 12/12/21.

## 2021-12-15 NOTE — Telephone Encounter (Signed)
Collected FMLA form and payment.  Form in Dr. Debby Bud box.

## 2021-12-16 ENCOUNTER — Encounter: Payer: Self-pay | Admitting: Family Medicine

## 2021-12-16 DIAGNOSIS — S838X2A Sprain of other specified parts of left knee, initial encounter: Secondary | ICD-10-CM | POA: Insufficient documentation

## 2021-12-16 HISTORY — DX: Sprain of other specified parts of left knee, initial encounter: S83.8X2A

## 2021-12-16 NOTE — Progress Notes (Signed)
  Kelly Morton - 26 y.o. female MRN [id number]  Date of birth: 1996-03-21    SUBJECTIVE:      Chief Complaint:/ HPI:  Acute left knee injury in 2016.  Has had some chronic issues since then.  Over the last few months it has become more problematic.  Knee occasionally locks and she has to manipulate it to get it straightened out again.  This happens typically once or twice a month.  She also has sensation of giving way but has had no falls.  She has medial sided knee pain intermittently.  Initial injury occurred when she was doing deep squats with 100+ pounds.    OBJECTIVE: BP 120/82   Ht 5\' 8"  (1.727 m)   Wt 215 lb (97.5 kg)   LMP 11/11/2021   BMI 32.69 kg/m   Physical Exam:  Vital signs are reviewed. GENERAL: Well-developed female no acute distress KNEES: Symmetrical.  No effusion.  Left knee has mild tenderness to palpation at the medial anterior joint line.  She is ligamentously intact to varus and valgus stress.  Anterior drawer and Lachman are both symmetrical and have good endpoints compared with the contralateral side.  McMurray is positive with pain and pop at the medial joint line.  ASSESSMENT & PLAN:  See problem based charting & AVS for pt instructions. Injury of meniscus of left knee History and clinical exam consistent with medial meniscal tear.   .  We will get x-ray today.  Discussed options.  Given her relatively young age, would be more aggressive and consider surgical intervention.  I am also concerned with the recurrent locking that she will further tear the remaining meniscus.  She is changing insurances very soon so we will try to get this done as soon as possible. Given the fact that she is having increasing episodes of locking, will proceed with MRI  Follow-up after MRI.

## 2021-12-16 NOTE — Assessment & Plan Note (Addendum)
History and clinical exam consistent with medial meniscal tear.   .  We will get x-ray today.  Discussed options.  Given her relatively young age, would be more aggressive and consider surgical intervention.  I am also concerned with the recurrent locking that she will further tear the remaining meniscus.  She is changing insurances very soon so we will try to get this done as soon as possible. Given the fact that she is having increasing episodes of locking, will proceed with MRI  Follow-up after MRI.

## 2021-12-19 ENCOUNTER — Encounter: Payer: Self-pay | Admitting: Family Medicine

## 2021-12-21 ENCOUNTER — Telehealth: Payer: Self-pay | Admitting: Internal Medicine

## 2021-12-21 ENCOUNTER — Ambulatory Visit
Admission: RE | Admit: 2021-12-21 | Discharge: 2021-12-21 | Disposition: A | Discharge status: Home / Self Care | Payer: 59 | Source: Ambulatory Visit | Attending: Family Medicine | Admitting: Family Medicine

## 2021-12-21 DIAGNOSIS — M25562 Pain in left knee: Secondary | ICD-10-CM

## 2021-12-21 NOTE — Telephone Encounter (Signed)
FMLA paperwork filled out by MD and placed at front desk by RN.

## 2021-12-21 NOTE — Telephone Encounter (Signed)
Paper work faxed to [employer] and scanned into documents.

## 2021-12-22 ENCOUNTER — Telehealth: Payer: Self-pay | Admitting: Internal Medicine

## 2021-12-22 NOTE — Telephone Encounter (Signed)
Called pt advised that would fill in date range for intermittent leave.  Scheduled fall f/u per MD OV note for 02/26/22 at 8:20 am. No further questions or concerns.

## 2021-12-22 NOTE — Telephone Encounter (Signed)
FMLA paperwork placed at front desk to be refaxed.

## 2021-12-22 NOTE — Telephone Encounter (Signed)
Patient called about her FL[employer] forms stating that for the intermittent part of it was crossed out and written unclear.  Patient states it can have a date from a year from now.  She also wants to know if she is going to have a follow up visit with provider.

## 2021-12-22 NOTE — Patient Instructions (Signed)
Dr [facility] [address] [place] [phone] Friday 12/29/21 @ 2:30a

## 2021-12-26 ENCOUNTER — Telehealth: Payer: Self-pay | Admitting: Internal Medicine

## 2021-12-26 NOTE — Telephone Encounter (Signed)
Patient wanted to talk with Dr. Izora Ribas or nurse in regards to her FMLA being denied by PCP due to it not being the lead problem for her fainting. Please call back to discuss

## 2021-12-27 NOTE — Telephone Encounter (Signed)
Called pt who reports that FMLA was denied.  I asked what more is needed from our office.  Pt is unsure will send an email to FMLA servicer to see what is needed. No further concerns at this time.

## 2021-12-27 NOTE — Telephone Encounter (Signed)
Pt is requesting the following change to [facility] paperwork.  Change question Part B #4 to yes this is a chronic condition and MD needs to initial.  Will have MD make changes at next OV day.

## 2021-12-27 NOTE — Telephone Encounter (Signed)
Pt returning nurses call. Call transferred 

## 2021-12-29 NOTE — Telephone Encounter (Signed)
MD updated and initialed FMLA paperwork.  Placed at the front desk by RN to be faxed.

## 2022-01-29 ENCOUNTER — Other Ambulatory Visit (HOSPITAL_COMMUNITY): Payer: Self-pay

## 2022-01-29 ENCOUNTER — Other Ambulatory Visit: Payer: Self-pay

## 2022-01-29 ENCOUNTER — Encounter: Payer: Self-pay | Admitting: Student

## 2022-01-29 DIAGNOSIS — F32A Depression, unspecified: Secondary | ICD-10-CM

## 2022-01-29 MED ORDER — ESCITALOPRAM OXALATE 10 MG PO TABS
15.0000 mg | ORAL_TABLET | Freq: Every day | ORAL | 0 refills | Status: DC
  Filled 2022-01-29: qty 90, 60d supply, fill #0

## 2022-01-29 NOTE — Telephone Encounter (Signed)
Patient calls nurse line requesting refills on Lexapro 15mg .   Patient reports her insurance changed and her behavorial health provider is no longer in network.   Patient is fearful to be abruptly taking off this medication.   Will forward to PCP.

## 2022-01-31 MED ORDER — ESCITALOPRAM OXALATE 10 MG PO TABS
15.0000 mg | ORAL_TABLET | Freq: Every day | ORAL | 0 refills | Status: DC
  Filled 2022-01-31: qty 30, 20d supply, fill #0

## 2022-02-01 ENCOUNTER — Other Ambulatory Visit (HOSPITAL_COMMUNITY): Payer: Self-pay

## 2022-02-23 NOTE — Progress Notes (Unsigned)
.  Cardiology Office Note:    Date:  02/26/2022   ID:  Kelly Morton, DOB 1995/11/04, MRN [id number]  PCP:  Alicia Amel, MD  [facility] Cardiologist:  Christell Constant, MD  [facility] Electrophysiologist:  None   CC: Follow up Syncope  History of Present Illness:    Kelly Morton is a 26 y.o. female with a hx of syncope, PTSD, chronic migraine, and hx of IDA last seen 01/01/20.  Possible MAD on echo.  Was doing well.   2023 two repeat near syncopal episodes. Found to have MAD with no high risk features on CMR.  We filed out her FMLA paperwork and she was approved.  Patient notes that she is doing OK.   She transitioned to her new insurance and was having issued getting her meds (age 89). Stared doing her exercises.  5/10; they have help. She just missed passing her therapy test. She is now in medical records.    No chest pain or pressure .  No SOB/DOE and no PND/Orthopnea.  No weight gain or leg swelling.  Has had two episodes of near syncope related to her not being on her medications due to insurance.   Past Medical History:  Diagnosis Date   Ovarian cyst    PTSD (post-traumatic stress disorder)    Syncope     Past Surgical History:  Procedure Laterality Date   OOPHORECTOMY     TONSILLECTOMY      Current Medications: Current Meds  Medication Sig   acetaminophen (TYLENOL) 325 MG tablet Take 650 mg by mouth every 6 (six) hours as needed for mild pain.   aspirin-acetaminophen-caffeine (EXCEDRIN MIGRAINE) 250-250-65 MG tablet Take 3 tablets by mouth every 6 (six) hours as needed for headache or migraine.    escitalopram (LEXAPRO) 10 MG tablet Take 1.5 tablets (15 mg total) by mouth daily.   fluticasone (FLONASE) 50 MCG/ACT nasal spray as needed for congestion.   hydrocortisone (ANUSOL-HC) 25 MG suppository Place 1 suppository (25 mg total) rectally 2 (two) times daily. (Patient taking differently: Place 25 mg rectally as needed for hemorrhoids or  anal itching.)   hydrOXYzine (ATARAX) 10 MG tablet hydroxyzine HCl 10 mg tablet  TAKE 1/2 TO 2 TABLETS AS NEEDED BY MOUTH UP TO TWICE DAILY FOR ANXIETY   ibuprofen (ADVIL) 600 MG tablet Take 600 mg by mouth every 6 (six) hours as needed for moderate pain or cramping.   LORazepam (ATIVAN) 1 MG tablet Take 1 tablet (1 mg total) by mouth 30 minutes prior to cardiac testing   norethindrone (MICRONOR) 0.35 MG tablet Take 1 tablet by mouth daily.     Allergies:   Animator, Bactrim [sulfamethoxazole-trimethoprim], and Elemental sulfur   Social History   Socioeconomic History   Marital status: Significant Other    Spouse name: Not on file   Number of children: Not on file   Years of education: Not on file   Highest education level: Not on file  Occupational History   Not on file  Tobacco Use   Smoking status: Never   Smokeless tobacco: Never  Substance and Sexual Activity   Alcohol use: Yes    Alcohol/week: 6.0 standard drinks of alcohol    Types: 6 Glasses of wine per week   Drug use: Never   Sexual activity: Not on file  Other Topics Concern   Not on file  Social History Narrative   Not on file   Social Determinants of Health   Financial  Resource Strain: Not on file  Food Insecurity: Not on file  Transportation Needs: Not on file  Physical Activity: Not on file  Stress: Not on file  Social Connections: Not on file   Social:  Saw patient with her fiance in previous visit; she is finished for counseling and is pending her exam (repeat exam, she is going to do great) and lifts weights, worked at [employer] (now in medical records)  Family History: The patient's family history includes Cervical cancer in her maternal grandmother; Diabetes in her paternal grandmother; Hypertension in her father and mother; Lymphoma in her maternal grandfather; Prostate cancer in her maternal grandfather; Stroke in her mother.  Grandmother has arrhythmia lived to 61; no SCD, drowning's car  accidents.  ROS:   Please see the history of present illness.    All other systems reviewed and are negative.  EKGs/Labs/Other Studies Reviewed:    The following studies were reviewed today:  EKG: 09/27/21:  SR  07/01/20: Sinus Rhythm 81 short PR  Cardiac Studies & Procedures       ECHOCARDIOGRAM  ECHOCARDIOGRAM COMPLETE 12/17/2019  Narrative ECHOCARDIOGRAM REPORT    Patient Name:   Kelly Morton Date of Exam: 12/17/2019 Medical Rec #:  [id number]         Height:       68.0 in Accession #:    [id number]        Weight:       186.6 lb Date of Birth:  1995-08-23          BSA:          1.984 m Patient Age:    24 years          BP:           117/75 mmHg Patient Gender: F                 HR:           86 bpm. Exam Location:  Inpatient  Procedure: 2D Echo and Strain Analysis  Indications:    Syncope 780.2 / R55  History:        Patient has no prior history of Echocardiogram examinations. Patient presenting with dozens of episodes of seizure-like activity in the past 4 days.  Sonographer:    Darlina Sicilian RDCS Referring Phys: [id number] [place]   1. Mild bileaflet mitral valve prolapse with mild mitral annular disjunction. Displacement distance 6 mm. The mitral valve is myxomatous. Mild mitral valve regurgitation. No evidence of mitral stenosis. 2. Left ventricular ejection fraction, by estimation, is 60 to 65%. The left ventricle has normal function. The left ventricle has no regional wall motion abnormalities. Left ventricular diastolic parameters were normal. The average left ventricular global longitudinal strain is -20.4 %. The global longitudinal strain is normal. 3. Right ventricular systolic function is normal. The right ventricular size is normal. 4. The aortic valve is tricuspid. Aortic valve regurgitation is not visualized. No aortic stenosis is present.  FINDINGS Left Ventricle: Left ventricular ejection fraction, by estimation, is 60 to  65%. The left ventricle has normal function. The left ventricle has no regional wall motion abnormalities. The average left ventricular global longitudinal strain is -20.4 %. The global longitudinal strain is normal. The left ventricular internal cavity size was normal in size. There is no left ventricular hypertrophy. Left ventricular diastolic parameters were normal.  Right Ventricle: The right ventricular size is normal. No increase in right ventricular wall thickness. Right ventricular systolic  function is normal.  Left Atrium: Left atrial size was normal in size.  Right Atrium: Right atrial size was normal in size.  Pericardium: There is no evidence of pericardial effusion.  Mitral Valve: Mild bileaflet mitral valve prolapse with mild mitral annular disjunction. Displacement distance 6 mm. The mitral valve is myxomatous. Mild mitral valve regurgitation. No evidence of mitral valve stenosis.  Tricuspid Valve: The tricuspid valve is normal in structure. Tricuspid valve regurgitation is trivial. No evidence of tricuspid stenosis.  Aortic Valve: The aortic valve is tricuspid. Aortic valve regurgitation is not visualized. No aortic stenosis is present.  Pulmonic Valve: The pulmonic valve was normal in structure. Pulmonic valve regurgitation is trivial. No evidence of pulmonic stenosis.  Aorta: The aortic root is normal in size and structure.  Venous: The inferior vena cava was not well visualized.  IAS/Shunts: The interatrial septum was not well visualized.   LEFT VENTRICLE PLAX 2D LVIDd:         4.40 cm  Diastology LVIDs:         2.60 cm  LV e' lateral:   12.40 cm/s LV PW:         0.80 cm  LV E/e' lateral: 4.0 LV IVS:        0.80 cm  LV e' medial:    10.60 cm/s LVOT diam:     1.90 cm  LV E/e' medial:  4.7 LV SV:         39 LV SV Index:   20       2D Longitudinal Strain LVOT Area:     2.84 cm 2D Strain GLS Avg:     -20.4 %   RIGHT VENTRICLE RV S prime:     17.40 cm/s  LEFT  ATRIUM             Index       RIGHT ATRIUM          Index LA diam:        3.30 cm 1.66 cm/m  RA Area:     8.88 cm LA Vol (A2C):   29.6 ml 14.92 ml/m RA Volume:   13.10 ml 6.60 ml/m LA Vol (A4C):   22.4 ml 11.29 ml/m LA Biplane Vol: 26.8 ml 13.51 ml/m AORTIC VALVE LVOT Vmax:   82.20 cm/s LVOT Vmean:  56.200 cm/s LVOT VTI:    0.138 m  AORTA Ao Asc diam: 2.70 cm  MITRAL VALVE MV Area (PHT): 3.03 cm    SHUNTS MV Decel Time: 250 msec    Systemic VTI:  0.14 m MV E velocity: 49.30 cm/s  Systemic Diam: 1.90 cm MV A velocity: 49.90 cm/s MV E/A ratio:  0.99  Weston BrassGayatri Acharya MD Electronically signed by Weston BrassGayatri Acharya MD Signature Date/Time: 12/17/2019/12:07:34 PM    Final    MONITORS  LONG TERM MONITOR-LIVE TELEMETRY (3-14 DAYS) 01/19/2020  Narrative  Avg HR 79, min 44 bpm max 171 bpm  Predominant rhythm: Sinus Rhythm  1 3s Pause occurred  Isolated PVCs < 1.0%  Isolated PACs < 1.0%  Diary Events (Anxious X2, Dizziness X3, Frightened X1 Shaky X1) associated with sinus rhythm  Triggered Events Associated with sinus rhythm or sinus tachycardia  Riley LamMahesh Lestat Golob MD    CARDIAC MRI  MR CARDIAC MORPHOLOGY W WO CONTRAST 12/17/2021  Narrative CLINICAL DATA:  Clinical question of mitral annular disjunction Study assumes HCT of 42 and BSA of 2.16 m2.  EXAM: CARDIAC MRI  TECHNIQUE: The patient was scanned on a 1.5 Tesla  GE magnet. A dedicated cardiac coil was used. Functional imaging was done using Fiesta sequences. 2,3, and 4 chamber views were done to assess for RWMA's. Modified Simpson's rule using a short axis stack was used to calculate an ejection fraction on a dedicated work [employer]. The patient received 12 cc of Gadavist. After 10 minutes inversion recovery sequences were used to assess for infiltration and scar tissue. Velocity encoding used for valve assessment.  CONTRAST:  12 cc  of Gadavist  FINDINGS: 1. Normal left  ventricular size, with LVEDD 53 mm, and LVEDVi 62 mL/m2.  Normal left ventricular thickness, with intraventricular septal thickness of 8 mm, posterior wall thickness of 7 mm, and myocardial mass index of 40 g/m2.  Normal left ventricular systolic function (LVEF =59%). There are no regional wall motion abnormalities.  Left ventricular parametric mapping notable for normal T2 signal and normal ECV.  There is no late gadolinium enhancement in the left ventricular myocardium.  2. Normal right ventricular size with RVEDVI 65 mL/m2.  Normal right ventricular thickness.  Normal right ventricular systolic function (RVEF =54%). There are no regional wall motion abnormalities or aneurysms.  3.  Normal left and right atrial size.  4. Normal size of the aortic root, ascending aorta and pulmonary artery.  5. Valve assessment:  Aortic Valve: Tri-leaflet aortic valve. There is no significant regurgitation. Regurgitation fraction 1.5 %  Pulmonic Valve: There is no significant regurgitation. Regurgitation fraction < 1 %  Tricuspid Valve: There is no significant regurgitation.  Mitral Valve: There is mild significant regurgitation. Regurgitation fraction 11 %. There is evidence of mitral annular disjunction of the posterior mitral valve leaflet. Bifid anterior and posterior papillary muscles. There is anterior mitral valve prolapse. There is posterior mitral valve billowing. There is anterolateral chordal laxity. There is no papillary muscle late gadolinium enhancement.  6.  Normal pericardium.  No pericardial effusion.  7. Grossly, no extracardiac findings. Recommended dedicated study if concerned for non-cardiac pathology.  IMPRESSION: There is evidence of mitral annular disjunction without high risk features  Riley Lam MD   Electronically Signed By: Riley Lam M.D. On: 12/17/2021 14:25         Recent Labs: 12/12/2021: BUN 10; Creatinine, Ser  0.78; Hemoglobin 14.0; Platelets 280; Potassium 4.1; Sodium 138  Recent Lipid Panel No results found for: "CHOL", "TRIG", "HDL", "CHOLHDL", "VLDL", "LDLCALC", "LDLDIRECT"    Physical Exam:    VS:  BP 112/72   Pulse 71   Ht 5\' 8"  (1.727 m)   Wt 217 lb (98.4 kg)   SpO2 98%   BMI 32.99 kg/m     Wt Readings from Last 3 Encounters:  02/26/22 217 lb (98.4 kg)  12/15/21 215 lb (97.5 kg)  11/28/21 219 lb (99.3 kg)    GEN:  Well nourished, well developed in no acute distress HEENT: Normal NECK: No JVD LYMPHATICS: No lymphadenopathy CARDIAC: RRR, no murmurs, rubs, gallops RESPIRATORY:  Clear to auscultation without rales, wheezing or rhonchi  ABDOMEN: Soft, non-tender, non-distended MUSCULOSKELETAL:  No edema; No deformity  SKIN: Warm and dry  NEUROLOGIC:  Alert and oriented x 3 PSYCHIATRIC:  Normal affect   ASSESSMENT:    1. Vasovagal syncope   2. Mild mitral regurgitation   3. MVP (mitral valve prolapse)     PLAN:    Vasovagal syncope - stress the importance of salt and water intake - gave education on slow rise, Valsalva maneuver exacerbation, temperature change - discussed muscle contraction and leg crossing -  discussed elevation to 30-45 degrees when sleeping - reviewed her dysautonomia exercises - we have planned that if we start florinef we will concurrent referral her to dysautonomia clinic at [facility]  MAD With MVP No significant MR or high risk factors - echo in 2026 - reviewed imaging with patient  One year with me or APP   Medication Adjustments/Labs and Tests Ordered: Current medicines are reviewed at length with the patient today.  Concerns regarding medicines are outlined above.  No orders of the defined types were placed in this encounter.  No orders of the defined types were placed in this encounter.   Patient Instructions  Medication Instructions:  NO CHANGES *If you need a refill on your cardiac medications before your next appointment, please  call your pharmacy*   Lab Work: NONE If you have labs (blood work) drawn today and your tests are completely normal, you will receive your results only by: MyChart Message (if you have MyChart) OR A paper copy in the mail If you have any lab test that is abnormal or we need to change your treatment, we will call you to review the results.   Testing/Procedures: NONE   Follow-Up: At [facility], you and your health needs are our priority.  As part of our continuing mission to provide you with exceptional heart care, we have created designated Provider Care Teams.  These Care Teams include your primary Cardiologist (physician) and Advanced Practice Providers (APPs -  Physician Assistants and Nurse Practitioners) who all work together to provide you with the care you need, when you need it.  We recommend signing up for the patient portal called "MyChart".  Sign up information is provided on this After Visit Summary.  MyChart is used to connect with patients for Virtual Visits (Telemedicine).  Patients are able to view lab/test results, encounter notes, upcoming appointments, etc.  Non-urgent messages can be sent to your provider as well.   To learn more about what you can do with MyChart, go to [email or web address].    Your next appointment:   1 year(s)  The format for your next appointment:   In Person  Provider:   Christell Constant, MD     Other Instructions NONE  Important Information About Sugar         Signed, Christell Constant, MD  02/26/2022 8:56 AM    [facility]

## 2022-02-26 ENCOUNTER — Encounter: Payer: Self-pay | Admitting: Internal Medicine

## 2022-02-26 ENCOUNTER — Ambulatory Visit: Payer: No Typology Code available for payment source | Attending: Internal Medicine | Admitting: Internal Medicine

## 2022-02-26 VITALS — BP 112/72 | HR 71 | Ht 68.0 in | Wt 217.0 lb

## 2022-02-26 DIAGNOSIS — I340 Nonrheumatic mitral (valve) insufficiency: Secondary | ICD-10-CM

## 2022-02-26 DIAGNOSIS — R55 Syncope and collapse: Secondary | ICD-10-CM

## 2022-02-26 DIAGNOSIS — I341 Nonrheumatic mitral (valve) prolapse: Secondary | ICD-10-CM

## 2022-02-26 NOTE — Patient Instructions (Signed)
Medication Instructions:  NO CHANGES *If you need a refill on your cardiac medications before your next appointment, please call your pharmacy*   Lab Work: NONE If you have labs (blood work) drawn today and your tests are completely normal, you will receive your results only by: MyChart Message (if you have MyChart) OR A paper copy in the mail If you have any lab test that is abnormal or we need to change your treatment, we will call you to review the results.   Testing/Procedures: NONE   Follow-Up: At [facility], you and your health needs are our priority.  As part of our continuing mission to provide you with exceptional heart care, we have created designated Provider Care Teams.  These Care Teams include your primary Cardiologist (physician) and Advanced Practice Providers (APPs -  Physician Assistants and Nurse Practitioners) who all work together to provide you with the care you need, when you need it.  We recommend signing up for the patient portal called "MyChart".  Sign up information is provided on this After Visit Summary.  MyChart is used to connect with patients for Virtual Visits (Telemedicine).  Patients are able to view lab/test results, encounter notes, upcoming appointments, etc.  Non-urgent messages can be sent to your provider as well.   To learn more about what you can do with MyChart, go to [email or web address].    Your next appointment:   1 year(s)  The format for your next appointment:   In Person  Provider:   Mahesh A Chandrasekhar, MD     Other Instructions NONE  Important Information About Sugar       

## 2022-03-09 ENCOUNTER — Other Ambulatory Visit (HOSPITAL_COMMUNITY): Payer: Self-pay

## 2022-03-09 ENCOUNTER — Ambulatory Visit (INDEPENDENT_AMBULATORY_CARE_PROVIDER_SITE_OTHER): Payer: No Typology Code available for payment source | Admitting: Student

## 2022-03-09 ENCOUNTER — Encounter: Payer: Self-pay | Admitting: Student

## 2022-03-09 VITALS — BP 100/70 | HR 71 | Temp 98.8°F | Ht 68.0 in | Wt 218.6 lb

## 2022-03-09 DIAGNOSIS — Z7689 Persons encountering health services in other specified circumstances: Secondary | ICD-10-CM | POA: Insufficient documentation

## 2022-03-09 DIAGNOSIS — F419 Anxiety disorder, unspecified: Secondary | ICD-10-CM | POA: Diagnosis not present

## 2022-03-09 DIAGNOSIS — L409 Psoriasis, unspecified: Secondary | ICD-10-CM | POA: Diagnosis not present

## 2022-03-09 MED ORDER — ESCITALOPRAM OXALATE 10 MG PO TABS
15.0000 mg | ORAL_TABLET | Freq: Every day | ORAL | 3 refills | Status: DC
  Filled 2022-03-09 – 2022-03-28 (×2): qty 135, 90d supply, fill #0
  Filled 2022-06-24: qty 135, 90d supply, fill #1
  Filled 2022-09-17: qty 135, 90d supply, fill #2
  Filled 2022-12-17: qty 135, 90d supply, fill #3

## 2022-03-09 MED ORDER — TRIAMCINOLONE ACETONIDE 0.1 % EX OINT
1.0000 | TOPICAL_OINTMENT | Freq: Two times a day (BID) | CUTANEOUS | 0 refills | Status: AC
  Filled 2022-03-09: qty 454, 30d supply, fill #0

## 2022-03-09 NOTE — Assessment & Plan Note (Signed)
Exam today is more consistent with psoriasis than eczema. - Kenalog 0.1% Ointment

## 2022-03-09 NOTE — Patient Instructions (Signed)
Kelly Morton,  It is such a joy to meet you!! I'm going to enjoy being your PCP.  You seem to be doing quite well. I am sending an ointment in for your psoriasis. I am also refilling your Lexapro. If you decide you want to try [facility] for weight loss, please let me know. In the meantime, taking out the processed foods is working! Keep up the great work!!   I'm available as you need me, otherwise I'll see you in about a year,  Dorothyann Gibbs, MD

## 2022-03-09 NOTE — Assessment & Plan Note (Signed)
Weight down 4 lbs with reduction in processed foods. Congratulated on success! Had a discussion on indications for and risks/benefits of GLP-1 agonists. She will take some time to think about it and will let me know if she wants to start [facility] 0.25mg  weekly.

## 2022-03-09 NOTE — Progress Notes (Signed)
    SUBJECTIVE:   CHIEF COMPLAINT / HPI:   Encounter to Establish Care With A New Provider Patient newly established with me. 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 She is engaged, her fiance also works at the [employer] in radiation oncology. She is working toward a Masters in Counseling right now. She is overall a healthy and active person. Previous medical encounters for syncope, thought to be vasovagal, with an incidental finding of mitral prolapse. Also evaluated for rectal bleeding thought to be from internal hemorrhoids. Has not been treated and continues intermittently, but does not seem to be evolving/progressing.   Anxiety Reports pretty good control on 15mg  of Lexapro. Has been on that dose for quite some time now and is stable. Finds that cannabinoids are likewise a helpful adjunct to managing her symptoms. Had been seeing a telehealth psychiatrist for a while, but our office is now managing her anxiety.    Weight Management Has been working with her fiance on weight loss. They are trying to become "wedding ready." Have cut back significantly on processed food and she is pleased that she has been able to lose 4 lbs. Is interested in at least discussing Wegovy as an option.   Rash Multiple plaques to elbows, knees, and R ankle that are intermittently itchy. Has been present for a while, initially diagnosed as eczema, but has not been using anything for treatment.    OBJECTIVE:   BP 100/70   Pulse 71   Temp 98.8 F (37.1 C)   Ht 5\' 8"  (1.727 m)   Wt 218 lb 9.6 oz (99.2 kg)   LMP 02/19/2022 (Exact Date)   SpO2 96%   BMI 33.24 kg/m   Gen: In good spirits, engaged in interview/exam. Initially anxious but anxiety subsided over course of assessment. HENT: MMM Cardio: RRR, no m/r/g Pulm: Normal WOB on RA, lungs CTAB Abd: Soft, non-distended Ext: Without edema or deformity Skin: Multiple scaly erythematous plaques to  extensor surfaces of knees/elbows, and R ankle as below Psych: Anxious mood and affect at first, improved over course of exam   ASSESSMENT/PLAN:   Encounter to establish care Patient is up to date on age appropriate screenings. Had Pap with her OB/Gyn in Jan 2022. Has had flu shot for work. Reviewed appropriate uses for MyChart messaging and follow-up visits.  - Follow-up in 1 year or sooner  Anxiety Stable on current regimen. Will monitor closely as the stress of an upcoming wedding and ongoing challenges of grad school may increase symptoms.   Encounter for weight management Weight down 4 lbs with reduction in processed foods. Congratulated on success! Had a discussion on indications for and risks/benefits of GLP-1 agonists. She will take some time to think about it and will let me know if she wants to start [facility] 0.25mg  weekly.   Psoriasis Exam today is more consistent with psoriasis than eczema. - Kenalog 0.1% Ointment     Feb 2022, MD [facility]

## 2022-03-09 NOTE — Assessment & Plan Note (Signed)
Stable on current regimen. Will monitor closely as the stress of an upcoming wedding and ongoing challenges of grad school may increase symptoms.

## 2022-03-09 NOTE — Assessment & Plan Note (Signed)
Patient is up to date on age appropriate screenings. Had Pap with her OB/Gyn in Jan 2022. Has had flu shot for work. Reviewed appropriate uses for MyChart messaging and follow-up visits.  - Follow-up in 1 year or sooner

## 2022-03-12 ENCOUNTER — Other Ambulatory Visit (HOSPITAL_COMMUNITY): Payer: Self-pay

## 2022-03-28 ENCOUNTER — Other Ambulatory Visit (HOSPITAL_COMMUNITY): Payer: Self-pay

## 2022-03-30 ENCOUNTER — Other Ambulatory Visit (HOSPITAL_COMMUNITY): Payer: Self-pay

## 2022-04-05 ENCOUNTER — Other Ambulatory Visit (HOSPITAL_COMMUNITY): Payer: Self-pay

## 2022-04-05 MED ORDER — IBUPROFEN 600 MG PO TABS
600.0000 mg | ORAL_TABLET | Freq: Two times a day (BID) | ORAL | 1 refills | Status: DC | PRN
  Filled 2022-04-05 – 2022-06-24 (×2): qty 30, 15d supply, fill #0

## 2022-04-17 ENCOUNTER — Encounter: Payer: Self-pay | Admitting: Student

## 2022-04-17 ENCOUNTER — Other Ambulatory Visit (HOSPITAL_COMMUNITY): Payer: Self-pay

## 2022-04-19 ENCOUNTER — Telehealth: Payer: Self-pay

## 2022-04-19 NOTE — Telephone Encounter (Signed)
Fax from [employer] received.  Place at front desk per protocol; pt will need to pay fee.

## 2022-05-01 ENCOUNTER — Other Ambulatory Visit (HOSPITAL_COMMUNITY): Payer: Self-pay

## 2022-05-01 DIAGNOSIS — R102 Pelvic and perineal pain: Secondary | ICD-10-CM | POA: Diagnosis not present

## 2022-05-01 MED ORDER — NORETHINDRONE 0.35 MG PO TABS
1.0000 | ORAL_TABLET | Freq: Every day | ORAL | 0 refills | Status: DC
Start: 2022-05-01 — End: 2022-10-19
  Filled 2022-05-01: qty 84, 84d supply, fill #0

## 2022-05-02 ENCOUNTER — Other Ambulatory Visit (HOSPITAL_COMMUNITY): Payer: Self-pay

## 2022-05-08 ENCOUNTER — Other Ambulatory Visit (HOSPITAL_COMMUNITY): Payer: Self-pay

## 2022-05-08 DIAGNOSIS — Z3041 Encounter for surveillance of contraceptive pills: Secondary | ICD-10-CM | POA: Diagnosis not present

## 2022-05-08 DIAGNOSIS — N924 Excessive bleeding in the premenopausal period: Secondary | ICD-10-CM | POA: Diagnosis not present

## 2022-05-08 DIAGNOSIS — N945 Secondary dysmenorrhea: Secondary | ICD-10-CM | POA: Diagnosis not present

## 2022-05-08 DIAGNOSIS — Z1389 Encounter for screening for other disorder: Secondary | ICD-10-CM | POA: Diagnosis not present

## 2022-05-08 DIAGNOSIS — E282 Polycystic ovarian syndrome: Secondary | ICD-10-CM | POA: Diagnosis not present

## 2022-05-08 DIAGNOSIS — Z130 Encounter for screening for diseases of the blood and blood-forming organs and certain disorders involving the immune mechanism: Secondary | ICD-10-CM | POA: Diagnosis not present

## 2022-05-08 DIAGNOSIS — Z01419 Encounter for gynecological examination (general) (routine) without abnormal findings: Secondary | ICD-10-CM | POA: Diagnosis not present

## 2022-05-08 MED ORDER — METFORMIN HCL 500 MG PO TABS
ORAL_TABLET | ORAL | 0 refills | Status: DC
Start: 2022-05-08 — End: 2023-01-01
  Filled 2022-05-08: qty 90, 90d supply, fill #0

## 2022-05-08 NOTE — Telephone Encounter (Signed)
FMLA paperwork filled out by MD on 05/04/22.  Placed at front desk on 05/08/22.

## 2022-05-14 ENCOUNTER — Encounter: Payer: Self-pay | Admitting: Student

## 2022-06-18 NOTE — Telephone Encounter (Signed)
Patient has not come in to pay or sign release of information so that we can send the FMLA form to [employer]. Spoke with patient on 05/28/22 who said that she would come in that week to sign and pay. Left message on 06/12/22 for patient to call us.  I have scanned completed [employer] form into patient's documents for future reference or in case patient changes her mind and wants the form sent to [employer].

## 2022-06-21 ENCOUNTER — Telehealth: Payer: Self-pay | Admitting: *Deleted

## 2022-06-21 ENCOUNTER — Telehealth: Payer: Self-pay | Admitting: Internal Medicine

## 2022-06-21 NOTE — Telephone Encounter (Signed)
   Pre-operative Risk Assessment    Patient Name: Kelly Morton  DOB: 06-Aug-1995 MRN: [id number]     Request for Surgical Clearance    Procedure:  Dental Extraction - Amount of Teeth to be Pulled:  3  Date of Surgery:  Clearance TBD                                 Surgeon:  Dr. Eldridge Abrahams Surgeon's Group or Practice Name:  [facility] Phone number:  [phone]  Fax number:  [phone]   Type of Clearance Requested:   - Medical    Type of Anesthesia:   Moderate conscious sedation   Additional requests/questions:   Caller stated they faxed written clearance request on 2/12.    Signed, Heloise Beecham   06/21/2022, 2:08 PM

## 2022-06-21 NOTE — Telephone Encounter (Signed)
   Name: Kelly Morton  DOB: 1995/12/27  MRN: [id number]  Primary Cardiologist: Werner Lean, MD  Chart reviewed as part of pre-operative protocol coverage. Because of Kelly Morton's past medical history and time since last visit, she will require a follow-up telephone visit in order to better assess preoperative cardiovascular risk.  Pre-op covering staff: - Please schedule appointment and call patient to inform them. If patient already had an upcoming appointment within acceptable timeframe, please add "pre-op clearance" to the appointment notes so provider is aware. - Please contact requesting surgeon's office via preferred method (i.e, phone, fax) to inform them of need for appointment prior to surgery.  No medications are indicated as needing held.  Elgie Collard, PA-C  06/21/2022, 5:44 PM

## 2022-06-21 NOTE — Telephone Encounter (Signed)
   Pre-operative Risk Assessment    Patient Name: Kelly Morton  DOB: April 06, 1996 MRN: [id number]     Request for Surgical Clearance    Procedure:  Dental Extraction - Amount of Teeth to be Pulled:  WISDOM   Date of Surgery:  Clearance TBD                                 Surgeon:  [facility]  Surgeon's Group or Practice Name:  [facility]  Phone number:  [phone] Fax number:  [phone]    Type of Clearance Requested:   - Medical    Type of Anesthesia:  General  INTRAVENOUS    Additional requests/questions:   N/A   Kathleen Argue   06/21/2022, 4:36 PM

## 2022-06-22 ENCOUNTER — Telehealth: Payer: Self-pay | Admitting: *Deleted

## 2022-06-22 NOTE — Telephone Encounter (Signed)
Pt has been scheduled for tele pre op appt 06/27/22 @ 9:20. Med rec and consent are done.

## 2022-06-22 NOTE — Telephone Encounter (Signed)
Duplicate request, will delete this.

## 2022-06-22 NOTE — Telephone Encounter (Signed)
Pt has been scheduled for tele pre op appt 06/27/22 @ 9:20. Med rec and consent are done.      Patient Consent for Virtual Visit        Kelly Morton has provided verbal consent on 06/22/2022 for a virtual visit (video or telephone).   CONSENT FOR VIRTUAL VISIT FOR:  Kelly Morton  By participating in this virtual visit I agree to the following:  I hereby voluntarily request, consent and authorize [facility] and its employed or contracted physicians, physician assistants, nurse practitioners or other licensed health care professionals (the Practitioner), to provide me with telemedicine health care services (the "Services") as deemed necessary by the treating Practitioner. I acknowledge and consent to receive the Services by the Practitioner via telemedicine. I understand that the telemedicine visit will involve communicating with the Practitioner through live audiovisual communication technology and the disclosure of certain medical information by electronic transmission. I acknowledge that I have been given the opportunity to request an in-person assessment or other available alternative prior to the telemedicine visit and am voluntarily participating in the telemedicine visit.  I understand that I have the right to withhold or withdraw my consent to the use of telemedicine in the course of my care at any time, without affecting my right to future care or treatment, and that the Practitioner or I may terminate the telemedicine visit at any time. I understand that I have the right to inspect all information obtained and/or recorded in the course of the telemedicine visit and may receive copies of available information for a reasonable fee.  I understand that some of the potential risks of receiving the Services via telemedicine include:  Delay or interruption in medical evaluation due to technological equipment failure or disruption; Information transmitted may not be sufficient  (e.g. poor resolution of images) to allow for appropriate medical decision making by the Practitioner; and/or  In rare instances, security protocols could fail, causing a breach of personal health information.  Furthermore, I acknowledge that it is my responsibility to provide information about my medical history, conditions and care that is complete and accurate to the best of my ability. I acknowledge that Practitioner's advice, recommendations, and/or decision may be based on factors not within their control, such as incomplete or inaccurate data provided by me or distortions of diagnostic images or specimens that may result from electronic transmissions. I understand that the practice of medicine is not an exact science and that Practitioner makes no warranties or guarantees regarding treatment outcomes. I acknowledge that a copy of this consent can be made available to me via my patient portal ([facility]), or I can request a printed copy by calling the office of [facility].    I understand that my insurance will be billed for this visit.   I have read or had this consent read to me. I understand the contents of this consent, which adequately explains the benefits and risks of the Services being provided via telemedicine.  I have been provided ample opportunity to ask questions regarding this consent and the Services and have had my questions answered to my satisfaction. I give my informed consent for the services to be provided through the use of telemedicine in my medical care

## 2022-06-25 ENCOUNTER — Other Ambulatory Visit: Payer: Self-pay

## 2022-06-26 ENCOUNTER — Other Ambulatory Visit (HOSPITAL_COMMUNITY): Payer: Self-pay

## 2022-06-26 NOTE — Progress Notes (Unsigned)
Virtual Visit via Telephone Note   Because of Kelly Morton's co-morbid illnesses, she is at least at moderate risk for complications without adequate follow up.  This format is felt to be most appropriate for this patient at this time.  The patient did not have access to video technology/had technical difficulties with video requiring transitioning to audio format only (telephone).  All issues noted in this document were discussed and addressed.  No physical exam could be performed with this format.  Please refer to the patient's chart for her consent to telehealth for [facility].  Evaluation Performed:  Preoperative cardiovascular risk assessment _____________   Date:  06/26/2022   Patient ID:  Kelly Morton, DOB [birth year], MRN [id number] Patient Location:  Home Provider location:   Office  Primary Care Provider:  Eppie Gibson, MD Primary Cardiologist:  Werner Lean, MD  Chief Complaint / Patient Profile   27 y.o. y/o female with a h/o mitral valve prolapse, vasovagal syncope who is pending dental extraction (3 teeth) and presents today for telephonic preoperative cardiovascular risk assessment.  History of Present Illness    Kelly Morton is a 27 y.o. female who presents via audio/video conferencing for a telehealth visit today.  Pt was last seen in cardiology clinic on 02/26/2022 by Dr. Gasper Sells.  At that time Kelly Morton was doing well .  The patient is now pending procedure as outlined above. Since her last visit, she she remains stable from a cardiac standpoint.  Today she denies chest pain, shortness of breath, lower extremity edema, fatigue, palpitations, melena, hematuria, hemoptysis, diaphoresis, weakness, orthopnea, and PND.   Past Medical History    Past Medical History:  Diagnosis Date   H. pylori infection 08/28/2020   Injury of meniscus of left knee 12/16/2021   Ovarian cyst    PTSD (post-traumatic stress disorder)     Sore throat 05/18/2020   Syncope    Past Surgical History:  Procedure Laterality Date   OOPHORECTOMY     TONSILLECTOMY      Allergies  Allergies  Allergen Reactions   Fire Ant Anaphylaxis   Bactrim [Sulfamethoxazole-Trimethoprim] Hives   Elemental Sulfur Hives    Home Medications    Prior to Admission medications   Medication Sig Start Date End Date Taking? Authorizing Provider  acetaminophen (TYLENOL) 325 MG tablet Take 650 mg by mouth every 6 (six) hours as needed for mild pain.    [provider]  aspirin-acetaminophen-caffeine (EXCEDRIN MIGRAINE) [phone] MG tablet Take 3 tablets by mouth every 6 (six) hours as needed for headache or migraine.     [provider]  escitalopram (LEXAPRO) 10 MG tablet Take 1.5 tablets (15 mg total) by mouth daily. 03/09/22   Eppie Gibson, MD  fluticasone (FLONASE) 50 MCG/ACT nasal spray as needed for congestion.    [provider]  hydrOXYzine (ATARAX) 10 MG tablet hydroxyzine HCl 10 mg tablet  TAKE 1/2 TO 2 TABLETS AS NEEDED BY MOUTH UP TO TWICE DAILY FOR ANXIETY    [provider]  ibuprofen (ADVIL) 600 MG tablet Take 600 mg by mouth every 6 (six) hours as needed for moderate pain or cramping.    [provider]  ibuprofen (ADVIL) 600 MG tablet Take 1 tablet (600 mg total) by mouth 2 (two) times daily as needed with food. Patient not taking: Reported on 06/22/2022 04/05/22     metFORMIN (GLUCOPHAGE) 500 MG tablet Take 1 tablet by mouth once daily 05/08/22  norethindrone (MICRONOR) 0.35 MG tablet Take 1 tablet by mouth daily. Patient not taking: Reported on 06/22/2022 08/11/21   [provider]  norethindrone (MICRONOR) 0.35 MG tablet Take 1 tablet (0.35 mg total) by mouth daily. 05/01/22     triamcinolone ointment (KENALOG) 0.1 % Apply topically 2 (two) times daily. 03/09/22   Eppie Gibson, MD    Physical Exam    Vital Signs:  Kelly Morton does not have vital signs  available for review today.  Given telephonic nature of communication, physical exam is limited. AAOx3. NAD. Normal affect.  Speech and respirations are unlabored.  Accessory Clinical Findings    None  Assessment & Plan    1.  Preoperative Cardiovascular Risk Assessment: Dental extraction, Dr. Eldridge Abrahams, [facility]     Primary Cardiologist: Werner Lean, MD  Chart reviewed as part of pre-operative protocol coverage. Given past medical history and time since last visit, based on ACC/AHA guidelines, Kelly Morton would be at acceptable risk for the planned procedure without further cardiovascular testing.   SBE prophylaxis is not required.  The patient was advised that if she develops new symptoms prior to surgery to contact our office to arrange for a follow-up visit, and she verbalized understanding.  I will route this recommendation to the requesting party via Epic fax function and remove from pre-op pool.     Time:   Today, I have spent 5 minutes with the patient with telehealth technology discussing medical history, symptoms, and management plan.  Prior to her phone evaluation I spent greater than 10 minutes reviewing her past medical history and cardiac medications.   Deberah Pelton, NP  06/26/2022, 3:57 PM

## 2022-06-27 ENCOUNTER — Ambulatory Visit: Payer: 59 | Attending: Internal Medicine

## 2022-06-27 DIAGNOSIS — Z01810 Encounter for preprocedural cardiovascular examination: Secondary | ICD-10-CM

## 2022-06-27 NOTE — Telephone Encounter (Signed)
I will send note to pre op APP to further advice if we can add pt back in today or if pt needs to reschedule.
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
## 2022-07-04 ENCOUNTER — Other Ambulatory Visit (HOSPITAL_COMMUNITY): Payer: Self-pay

## 2022-07-04 MED ORDER — HYDROCODONE-ACETAMINOPHEN 7.5-325 MG PO TABS
0.5000 | ORAL_TABLET | ORAL | 0 refills | Status: DC | PRN
  Filled 2022-07-04: qty 6, 1d supply, fill #0

## 2022-07-04 MED ORDER — KETOROLAC TROMETHAMINE 10 MG PO TABS
10.0000 mg | ORAL_TABLET | Freq: Four times a day (QID) | ORAL | 0 refills | Status: DC
  Filled 2022-07-04: qty 16, 4d supply, fill #0

## 2022-07-11 ENCOUNTER — Encounter: Payer: Self-pay | Admitting: Student

## 2022-07-11 ENCOUNTER — Other Ambulatory Visit (HOSPITAL_COMMUNITY): Payer: Self-pay

## 2022-07-11 DIAGNOSIS — J302 Other seasonal allergic rhinitis: Secondary | ICD-10-CM

## 2022-07-11 MED ORDER — FLUTICASONE PROPIONATE 50 MCG/ACT NA SUSP
2.0000 | Freq: Every day | NASAL | 3 refills | Status: AC
  Filled 2022-07-11: qty 16, 30d supply, fill #0
  Filled 2022-08-09 – 2022-08-22 (×2): qty 16, 30d supply, fill #1
  Filled 2023-01-02: qty 16, 30d supply, fill #2
  Filled 2023-01-27: qty 16, 30d supply, fill #3

## 2022-07-18 ENCOUNTER — Encounter: Payer: Self-pay | Admitting: *Deleted

## 2022-07-18 DIAGNOSIS — Z006 Encounter for examination for normal comparison and control in clinical research program: Secondary | ICD-10-CM

## 2022-07-18 NOTE — Research (Signed)
[facility] [employer]: A [employer] Research [employer] Study Patient [name] is a potential candidate for the above listed study.  This Clinical Research Nurse spoke with Traneisha Tuchman, [id number] over WebEx today to discuss participation in the [facility] research study.  A copy of the informed consent document with embedded HIPAA Authorization language was previously provided to the candidate.  The cadidate does read, speak, and understand [place].  Patient was encouraged to ask any questions they may have.  Patient confirmed they have read the informed consent documents.  Candidate was provided the option of reviewing informed consent documents at their convenience with their support network, including other care providers.  As outlined in the informed consent form, this Nurse and Emelia Salisbury discussed the purpose of the research study, the investigational nature of the study, study procedures and requirements for study participation, potential risks and benefits of study participation, confidentiality and how the patient's information will be used as part of study participation, as well as alternatives to participation were discussed.  Candidate was informed there is not reimbursement provided for their time and effort spent on trial participation.  The candidate understands participation is voluntary and they may withdraw from study participation at any time.   All questions were answered to candidate's satisfaction.  The informed consent document was reviewed page by page.  The candidates's mental and emotional status is appropriate to provide informed consent, and the patient does verbalize an understanding of study participation.  Candidate is comfortable with making a decision regarding study participation today.  Candidate has agreed to participate in the above listed research study and has voluntarily signed the informed consent document and embedded HIPAA authorization,  version 1 06/12/2022 on 07/18/22.  The patient was digitally provided with a copy of the signed informed consent form with embedded HIPAA Authorization.  No study specific procedures were obtained prior to the signing of the informed consent document.  Approximately 25 minutes were spent with the patient reviewing the informed consent documents.

## 2022-07-19 ENCOUNTER — Other Ambulatory Visit: Payer: Self-pay | Admitting: Oncology

## 2022-07-19 DIAGNOSIS — Z1379 Encounter for other screening for genetic and chromosomal anomalies: Secondary | ICD-10-CM

## 2022-07-23 ENCOUNTER — Other Ambulatory Visit (HOSPITAL_COMMUNITY)
Admission: RE | Admit: 2022-07-23 | Discharge: 2022-07-23 | Disposition: A | Discharge status: Home / Self Care | Payer: 59 | Source: Other Acute Inpatient Hospital | Attending: Oncology | Admitting: Oncology

## 2022-07-23 DIAGNOSIS — Z1379 Encounter for other screening for genetic and chromosomal anomalies: Secondary | ICD-10-CM | POA: Insufficient documentation

## 2022-07-25 ENCOUNTER — Other Ambulatory Visit (HOSPITAL_COMMUNITY): Payer: Self-pay

## 2022-07-25 ENCOUNTER — Telehealth: Payer: Self-pay | Admitting: Internal Medicine

## 2022-07-25 DIAGNOSIS — Z0279 Encounter for issue of other medical certificate: Secondary | ICD-10-CM

## 2022-07-25 MED ORDER — METFORMIN HCL 1000 MG PO TABS
1000.0000 mg | ORAL_TABLET | Freq: Two times a day (BID) | ORAL | 0 refills | Status: DC
  Filled 2022-07-25: qty 90, 45d supply, fill #0

## 2022-07-25 NOTE — Telephone Encounter (Signed)
Patient came to sign release forms and pay $29. Awaiting for new forms to be faxed due to old forms had expired before patient was able to come and sign release forms and pay. Once patient has forms faxed will update note and place in dr. Gasper Sells box.

## 2022-08-03 ENCOUNTER — Telehealth: Payer: Self-pay | Admitting: Internal Medicine

## 2022-08-03 NOTE — Telephone Encounter (Signed)
Called pt who reports spoke with [facility] front desk was advised to have [employer] fax over [facility] paperwork. I spoke with Milisa she reports never received paperwork.  Called pt back advised of this information and request pt to have [employer] resend FMLA paperwork.

## 2022-08-03 NOTE — Telephone Encounter (Signed)
Pt is calling to f/u on FMLA paperword. Please advise.

## 2022-08-06 ENCOUNTER — Encounter: Payer: Self-pay | Admitting: Internal Medicine

## 2022-08-08 NOTE — Telephone Encounter (Signed)
MD signed FMLA paperwork placed at front desk to f/u with pt.

## 2022-08-10 NOTE — Telephone Encounter (Signed)
Completed [employer] form faxed to [employer] and scanned into patient's documents. Patient and billing notified.

## 2022-08-15 ENCOUNTER — Encounter: Payer: Self-pay | Admitting: Student

## 2022-08-15 ENCOUNTER — Other Ambulatory Visit (HOSPITAL_COMMUNITY): Payer: Self-pay

## 2022-08-15 MED ORDER — VALACYCLOVIR HCL 1 G PO TABS
1000.0000 mg | ORAL_TABLET | Freq: Two times a day (BID) | ORAL | 0 refills | Status: AC
Start: 2022-08-15 — End: 2022-08-29
  Filled 2022-08-15: qty 14, 7d supply, fill #0

## 2022-08-17 ENCOUNTER — Other Ambulatory Visit (HOSPITAL_COMMUNITY): Payer: Self-pay

## 2022-08-22 ENCOUNTER — Other Ambulatory Visit (HOSPITAL_COMMUNITY): Payer: Self-pay

## 2022-08-24 LAB — HELIX MOLECULAR SCREEN: Genetic Analysis Overall Interpretation: NEGATIVE

## 2022-09-18 ENCOUNTER — Other Ambulatory Visit (HOSPITAL_COMMUNITY): Payer: Self-pay

## 2022-09-26 ENCOUNTER — Other Ambulatory Visit (HOSPITAL_COMMUNITY): Payer: Self-pay

## 2022-09-27 ENCOUNTER — Telehealth: Payer: Self-pay | Admitting: Internal Medicine

## 2022-09-27 NOTE — Telephone Encounter (Signed)
Called pt in regards to leg discomfort.  Reports right leg feels as if it has icy hot inside.  This feeling is intermittent occurs with sitting and standing.  Denies pain.  Has a hx of spider veins.  Reports had a recent shingles flair up that affected right thigh.  Was prescribed an antiviral reports did not take because felt better.   Left foot swelling began about a month ago.  Denies increased salt intake.  Denies indention when pushed in with fingertip. A few weeks ago had to loosen shoe strings no longer has to do this. Advised pt will speak with MD and call back.  Spoke with MD does not feel symptoms are heart r/t advised pt should f/u with PCP.  Review DVT precautions as a safety measure.   Called pt reviewed MD instructions above.  Scheduled f/u visit.

## 2022-09-27 NOTE — Telephone Encounter (Signed)
  Pt c/o swelling: STAT is pt has developed SOB within 24 hours  If swelling, where is the swelling located? Left foot   How much weight have you gained and in what time span?   Have you gained 3 pounds in a day or 5 pounds in a week?   Do you have a log of your daily weights (if so, list)? 215, 220, 219  Are you currently taking a fluid pill? No   Are you currently SOB? No   Have you traveled recently?  No   Pt said, her boyfriend told her, her right leg to her thighs feels hot to touch and her left foot is swelling. She wants to know if she need to see Dr. Izora Ribas

## 2022-10-11 ENCOUNTER — Telehealth: Payer: Self-pay | Admitting: Internal Medicine

## 2022-10-11 ENCOUNTER — Ambulatory Visit: Payer: 59 | Attending: Internal Medicine

## 2022-10-11 ENCOUNTER — Encounter: Payer: Self-pay | Admitting: Internal Medicine

## 2022-10-11 DIAGNOSIS — R55 Syncope and collapse: Secondary | ICD-10-CM

## 2022-10-11 NOTE — Addendum Note (Signed)
Addended by: Macie Burows on: 10/11/2022 03:46 PM   Modules accepted: Orders

## 2022-10-11 NOTE — Telephone Encounter (Signed)
Called pt in regards to dizzy spells.  Reports started about a week ago.  Pt can tell triggers for spells.  Reports has increased stress, change in weather and is also getting ready to start cycle.  Episodes of dizziness last for about an hour. Had an episode during a recent concert where became dizzy and heart was racing sat down until felt better.   Reports drinks adequate fluid intake.   Spoke with MD made the following recommendations:Sounds like her palpitations were worse (at the concert) and that her triggers for her orthostatic hypotension are more frequent with one near syncopal spell  - I do not want to blindly start medication for palpitations because it could worsen hypotension- will get ziopatch  - stress the importance of salt and water intake  - would use compression stockings and abdominal binders more frequently when her triggers act up   Called pt advised of MD recommendations.  Pt will come by the office today to have monitor placed.  No further questions at this time.

## 2022-10-11 NOTE — Telephone Encounter (Signed)
  STAT if patient feels like he/she is going to faint   Are you dizzy now? No   Do you feel faint or have you passed out? Yes   Do you have any other symptoms?   Have you checked your HR and BP (record if available)?     Pt said, she is having dizzy spells again, she did passed out but not recently. She is at work right now, but earlier while driving to work she felt dizzy that she have to pull over. She wanted to speak with Dr Debby Bud nurse

## 2022-10-11 NOTE — Telephone Encounter (Signed)
Placed referral for Dr. Christie Beckers  evaluation for autonomic dysfunction.  Called MD office was told scheduling out Jan 2026.

## 2022-10-11 NOTE — Progress Notes (Unsigned)
[facility] serial # [id number] with sensitive skin option left at front desk for patient to pick up Monday, 10/15/22.

## 2022-10-12 ENCOUNTER — Other Ambulatory Visit (HOSPITAL_COMMUNITY): Payer: Self-pay

## 2022-10-12 MED ORDER — METFORMIN HCL 1000 MG PO TABS
1000.0000 mg | ORAL_TABLET | Freq: Two times a day (BID) | ORAL | 0 refills | Status: DC
  Filled 2022-10-12: qty 90, 45d supply, fill #0

## 2022-10-17 DIAGNOSIS — R55 Syncope and collapse: Secondary | ICD-10-CM

## 2022-10-18 ENCOUNTER — Other Ambulatory Visit (HOSPITAL_COMMUNITY): Payer: Self-pay

## 2022-10-18 ENCOUNTER — Encounter: Payer: Self-pay | Admitting: Student

## 2022-10-18 DIAGNOSIS — U071 COVID-19: Secondary | ICD-10-CM

## 2022-10-19 ENCOUNTER — Other Ambulatory Visit (HOSPITAL_COMMUNITY): Payer: Self-pay

## 2022-10-19 MED ORDER — NIRMATRELVIR/RITONAVIR (PAXLOVID)TABLET
3.0000 | ORAL_TABLET | Freq: Two times a day (BID) | ORAL | 0 refills | Status: AC
  Filled 2022-10-19: qty 30, 5d supply, fill #0

## 2022-10-26 ENCOUNTER — Other Ambulatory Visit (HOSPITAL_COMMUNITY): Payer: Self-pay

## 2022-10-31 DIAGNOSIS — F908 Attention-deficit hyperactivity disorder, other type: Secondary | ICD-10-CM | POA: Diagnosis not present

## 2022-11-07 DIAGNOSIS — F908 Attention-deficit hyperactivity disorder, other type: Secondary | ICD-10-CM | POA: Diagnosis not present

## 2022-11-14 DIAGNOSIS — F908 Attention-deficit hyperactivity disorder, other type: Secondary | ICD-10-CM | POA: Diagnosis not present

## 2022-11-21 DIAGNOSIS — F908 Attention-deficit hyperactivity disorder, other type: Secondary | ICD-10-CM | POA: Diagnosis not present

## 2022-11-26 DIAGNOSIS — F4323 Adjustment disorder with mixed anxiety and depressed mood: Secondary | ICD-10-CM | POA: Diagnosis not present

## 2022-11-30 ENCOUNTER — Other Ambulatory Visit (HOSPITAL_COMMUNITY): Payer: Self-pay

## 2022-12-03 DIAGNOSIS — F908 Attention-deficit hyperactivity disorder, other type: Secondary | ICD-10-CM | POA: Diagnosis not present

## 2022-12-07 DIAGNOSIS — F908 Attention-deficit hyperactivity disorder, other type: Secondary | ICD-10-CM | POA: Diagnosis not present

## 2022-12-13 DIAGNOSIS — F908 Attention-deficit hyperactivity disorder, other type: Secondary | ICD-10-CM | POA: Diagnosis not present

## 2022-12-14 ENCOUNTER — Other Ambulatory Visit (HOSPITAL_COMMUNITY): Payer: Self-pay

## 2022-12-17 ENCOUNTER — Other Ambulatory Visit (HOSPITAL_COMMUNITY): Payer: Self-pay

## 2022-12-17 ENCOUNTER — Encounter (HOSPITAL_COMMUNITY): Payer: Self-pay

## 2022-12-17 ENCOUNTER — Emergency Department (HOSPITAL_COMMUNITY): Payer: 59

## 2022-12-17 ENCOUNTER — Emergency Department (HOSPITAL_COMMUNITY)
Admission: EM | Admit: 2022-12-17 | Discharge: 2022-12-17 | Disposition: A | Discharge status: Home / Self Care | Payer: 59 | Attending: Emergency Medicine | Admitting: Emergency Medicine

## 2022-12-17 ENCOUNTER — Telehealth: Payer: Self-pay

## 2022-12-17 ENCOUNTER — Encounter: Payer: Self-pay | Admitting: Student

## 2022-12-17 ENCOUNTER — Ambulatory Visit (INDEPENDENT_AMBULATORY_CARE_PROVIDER_SITE_OTHER): Payer: 59 | Admitting: Student

## 2022-12-17 ENCOUNTER — Ambulatory Visit: Payer: 59

## 2022-12-17 ENCOUNTER — Other Ambulatory Visit: Payer: Self-pay

## 2022-12-17 VITALS — BP 118/80 | HR 87 | Wt 201.0 lb

## 2022-12-17 DIAGNOSIS — R200 Anesthesia of skin: Secondary | ICD-10-CM | POA: Diagnosis not present

## 2022-12-17 DIAGNOSIS — E876 Hypokalemia: Secondary | ICD-10-CM | POA: Insufficient documentation

## 2022-12-17 DIAGNOSIS — R202 Paresthesia of skin: Secondary | ICD-10-CM | POA: Diagnosis not present

## 2022-12-17 DIAGNOSIS — R29818 Other symptoms and signs involving the nervous system: Secondary | ICD-10-CM | POA: Diagnosis not present

## 2022-12-17 DIAGNOSIS — Z7982 Long term (current) use of aspirin: Secondary | ICD-10-CM | POA: Insufficient documentation

## 2022-12-17 DIAGNOSIS — E871 Hypo-osmolality and hyponatremia: Secondary | ICD-10-CM | POA: Insufficient documentation

## 2022-12-17 LAB — COMPREHENSIVE METABOLIC PANEL
ALT: 16 U/L (ref 0–44)
AST: 15 U/L (ref 15–41)
Albumin: 4.3 g/dL (ref 3.5–5.0)
Alkaline Phosphatase: 45 U/L (ref 38–126)
Anion gap: 10 (ref 5–15)
BUN: 7 mg/dL (ref 6–20)
CO2: 20 mmol/L — ABNORMAL LOW (ref 22–32)
Calcium: 8.9 mg/dL (ref 8.9–10.3)
Chloride: 104 mmol/L (ref 98–111)
Creatinine, Ser: 0.68 mg/dL (ref 0.44–1.00)
GFR, Estimated: >60 mL/min (ref >60)
Glucose, Bld: 89 mg/dL (ref 70–99)
Potassium: 3.3 mmol/L — ABNORMAL LOW (ref 3.5–5.1)
Sodium: 134 mmol/L — ABNORMAL LOW (ref 135–145)
Total Bilirubin: 0.7 mg/dL (ref 0.3–1.2)
Total Protein: 7.5 g/dL (ref 6.5–8.1)

## 2022-12-17 LAB — APTT: aPTT: 26 seconds (ref 24–36)

## 2022-12-17 LAB — HCG, SERUM, QUALITATIVE: Preg, Serum: NEGATIVE

## 2022-12-17 LAB — ETHANOL: Alcohol, Ethyl (B): <10 mg/dL (ref <10)

## 2022-12-17 LAB — PROTIME-INR
INR: 0.9 (ref 0.8–1.2)
Prothrombin Time: 12.4 seconds (ref 11.4–15.2)

## 2022-12-17 LAB — DIFFERENTIAL
Abs Immature Granulocytes: 0.01 10*3/uL (ref 0.00–0.07)
Basophils Absolute: 0 10*3/uL (ref 0.0–0.1)
Basophils Relative: 0 %
Eosinophils Absolute: 0.1 10*3/uL (ref 0.0–0.5)
Eosinophils Relative: 2 %
Immature Granulocytes: 0 %
Lymphocytes Relative: 24 %
Lymphs Abs: 1.9 10*3/uL (ref 0.7–4.0)
Monocytes Absolute: 0.6 10*3/uL (ref 0.1–1.0)
Monocytes Relative: 7 %
Neutro Abs: 5.3 10*3/uL (ref 1.7–7.7)
Neutrophils Relative %: 67 %

## 2022-12-17 LAB — CBC
HCT: 38.7 % (ref 36.0–46.0)
Hemoglobin: 12.2 g/dL (ref 12.0–15.0)
MCH: 26.9 pg (ref 26.0–34.0)
MCHC: 31.5 g/dL (ref 30.0–36.0)
MCV: 85.4 fL (ref 80.0–100.0)
Platelets: 297 10*3/uL (ref 150–400)
RBC: 4.53 MIL/uL (ref 3.87–5.11)
RDW: 16 % — ABNORMAL HIGH (ref 11.5–15.5)
WBC: 8 10*3/uL (ref 4.0–10.5)
nRBC: 0 % (ref 0.0–0.2)

## 2022-12-17 MED ORDER — SODIUM CHLORIDE 0.9% FLUSH: 3.0000 mL | Freq: Once | INTRAVENOUS | Status: DC

## 2022-12-17 MED ORDER — VALACYCLOVIR HCL 1 G PO TABS
1000.0000 mg | ORAL_TABLET | Freq: Three times a day (TID) | ORAL | 0 refills | Status: AC
  Filled 2022-12-17: qty 21, 7d supply, fill #0

## 2022-12-17 NOTE — Discharge Instructions (Addendum)
You were seen today for left arm pain and facial numbness.  Your CT did not show any signs of stroke.  I have placed a referral for you to follow-up outpatient with neurology.  You can continue to take your gabapentin for pain.

## 2022-12-17 NOTE — ED Provider Notes (Signed)
[facility] EMERGENCY DEPARTMENT AT [facility] Provider Note   CSN: [id number] Arrival date & time: 12/17/22  1618     History {Add pertinent medical, surgical, social history, OB history to HPI:1} Chief Complaint  Patient presents with   Numbness    HPI  Kelly Morton is a 27 y.o. female.  With a history of migraine, psoriasis, vasovagal syncope, shingles presenting for facial numbness.  Patient reports that 5 days ago she noticed burning pain in her left arm that extended up into her shoulder.  3 days ago she noticed numbness in the left side of her face and head.  She was seen by her primary care doctor today and due to concern for stroke was sent here to be evaluated.  She reports that her symptoms are similar to what she experienced previously when she was diagnosed with shingles.  Denies fever, chills, neck pain, headache, dizziness, vision change.     Home Medications Prior to Admission medications   Medication Sig Start Date End Date Taking? Authorizing Provider  acetaminophen (TYLENOL) 325 MG tablet Take 650 mg by mouth every 6 (six) hours as needed for mild pain.    [provider]  aspirin-acetaminophen-caffeine (EXCEDRIN MIGRAINE) [phone] MG tablet Take 3 tablets by mouth every 6 (six) hours as needed for headache or migraine.     [provider]  escitalopram (LEXAPRO) 10 MG tablet Take 1.5 tablets (15 mg total) by mouth daily. 03/09/22   Alicia Amel, MD  fluticasone (FLONASE) 50 MCG/ACT nasal spray Place 2 sprays into both nostrils daily. 07/11/22   Alicia Amel, MD  hydrOXYzine (ATARAX) 10 MG tablet hydroxyzine HCl 10 mg tablet  TAKE 1/2 TO 2 TABLETS AS NEEDED BY MOUTH UP TO TWICE DAILY FOR ANXIETY    [provider]  ibuprofen (ADVIL) 600 MG tablet Take 600 mg by mouth every 6 (six) hours as needed for moderate pain or cramping.    [provider]  ibuprofen (ADVIL) 600 MG tablet Take 1 tablet (600 mg total)  by mouth 2 (two) times daily as needed with food. Patient not taking: Reported on 06/22/2022 04/05/22     metFORMIN (GLUCOPHAGE) 1000 MG tablet Take 1 tablet by mouth 2 times daily. 10/12/22     metFORMIN (GLUCOPHAGE) 500 MG tablet Take 1 tablet by mouth once daily 05/08/22     triamcinolone ointment (KENALOG) 0.1 % Apply topically 2 (two) times daily. 03/09/22   Alicia Amel, MD      Allergies    Fire ant, Bactrim [sulfamethoxazole-trimethoprim], and Elemental sulfur    Review of Systems   Review of Systems  Physical Exam Updated Vital Signs BP (!) 143/89   Pulse 79   Temp 98.3 F (36.8 C) (Oral)   Resp 20   Ht 5\' 8"  (1.727 m)   Wt 91.4 kg   LMP 11/25/2022   SpO2 99%   BMI 30.65 kg/m  Physical Exam Vitals and nursing note reviewed.  Constitutional:      General: She is not in acute distress.    Appearance: She is well-developed.  HENT:     Head: Normocephalic and atraumatic.  Eyes:     Conjunctiva/sclera: Conjunctivae normal.  Cardiovascular:     Rate and Rhythm: Normal rate and regular rhythm.     Heart sounds: No murmur heard. Pulmonary:     Effort: Pulmonary effort is normal. No respiratory distress.     Breath sounds: Normal breath sounds.  Abdominal:  Palpations: Abdomen is soft.     Tenderness: There is no abdominal tenderness.  Musculoskeletal:        General: No swelling.     Cervical back: Neck supple.  Skin:    General: Skin is warm and dry.     Capillary Refill: Capillary refill takes less than 2 seconds.  Neurological:     Mental Status: She is alert and oriented to person, place, and time.     GCS: GCS eye subscore is 4. GCS verbal subscore is 5. GCS motor subscore is 6.     Cranial Nerves: Cranial nerves 2-12 are intact.     Sensory: Sensory deficit present.     Motor: Motor function is intact. No weakness, tremor or abnormal muscle tone.     Coordination: Coordination is intact.     Comments: Subjective left-sided facial and left arm  decreased sensation, patient is able to sense touch  Psychiatric:        Mood and Affect: Mood normal.     ED Results / Procedures / Treatments   Labs (all labs ordered are listed, but only abnormal results are displayed) Labs Reviewed  CBC - Abnormal; Notable for the following components:      Result Value   RDW 16.0 (*)    All other components within normal limits  DIFFERENTIAL  PROTIME-INR  APTT  COMPREHENSIVE METABOLIC PANEL  ETHANOL  HCG, SERUM, QUALITATIVE    EKG None  Radiology CT HEAD WO CONTRAST  Result Date: 12/17/2022 CLINICAL DATA:  Acute neurological deficit with stroke suspected. EXAM: CT HEAD WITHOUT CONTRAST TECHNIQUE: Contiguous axial images were obtained from the base of the skull through the vertex without intravenous contrast. RADIATION DOSE REDUCTION: This exam was performed according to the departmental dose-optimization program which includes automated exposure control, adjustment of the mA and/or kV according to patient size and/or use of iterative reconstruction technique. COMPARISON:  CT head 12/14/2019.  MRI brain 12/17/2019 FINDINGS: Brain: No evidence of acute infarction, hemorrhage, hydrocephalus, extra-axial collection or mass lesion/mass effect. Vascular: No hyperdense vessel or unexpected calcification. Skull: Normal. Negative for fracture or focal lesion. Sinuses/Orbits: No acute finding. Other: None. IMPRESSION: No acute intracranial abnormalities. Electronically Signed   By: Burman Nieves M.D.   On: 12/17/2022 17:26    Procedures Procedures  {Document cardiac monitor, telemetry assessment procedure when appropriate:1}  Medications Ordered in ED Medications  sodium chloride flush (NS) 0.9 % injection 3 mL (has no administration in time range)    ED Course/ Medical Decision Making/ A&P   {   Click here for ABCD2, HEART and other calculatorsREFRESH Note before signing :1}                              Medical Decision Making Amount  and/or Complexity of Data Reviewed Labs: ordered. Radiology: ordered.   ***  {Document critical care time when appropriate:1} {Document review of labs and clinical decision tools ie heart score, Chads2Vasc2 etc:1}  {Document your independent review of radiology images, and any outside records:1} {Document your discussion with family members, caretakers, and with consultants:1} {Document social determinants of health affecting pt's care:1} {Document your decision making why or why not admission, treatments were needed:1} Final Clinical Impression(s) / ED Diagnoses Final diagnoses:  None    Rx / DC Orders ED Discharge Orders     None

## 2022-12-17 NOTE — Assessment & Plan Note (Addendum)
Unclear cause of patient's left-sided numbness including her face, left upper and lower extremity. Given acute nature of her presentation and transient left side vision changes believe it is appropriate for prompt imagining. Will send patient to the ED for further evaluation and to rule out intracranial processes.  Discussed need for ED, patient verbalized understanding and agreeable to plan. -ED triage nurse to inform the patient of the recommend to the ED -Provided patient with work excuse letter

## 2022-12-17 NOTE — Progress Notes (Signed)
    SUBJECTIVE:   CHIEF COMPLAINT / HPI:  Patient is a 27 year old female with past medical history significant for seizure-like activity, syncope and, mitral valve regurg presenting with symptoms of facial and left extremity numbness that started Wednesday about 5 to 6 days ago.  Symptoms started with left elbow pain radiating down to her hands and eventually moved up to the shoulder. Patient report intermittent sharp pain on the left leg and had a 2-3 minutes episode where her left leg felt heavy but this spontaneously resolved.  Left facial numbness includes cheek, lips, eyelids and forehead. Today the facial numbness seems to extend down to the left side of the neck and upper chest prompt her to come to the clinic. Of note patient reported a 2-3 mins vision change which she described as looking through a kaleidoscope, this vision change spontaneously improved .No headache, facial drop, speech difficult, recent falls and recent illness   PERTINENT  PMH / PSH: Reviewed  OBJECTIVE:   BP 118/80   Pulse 87   Wt 201 lb (91.2 kg)   LMP 11/25/2022   SpO2 98%   BMI 30.56 kg/m    Physical Exam General: Alert, well appearing, NAD Cardiovascular: RRR, No Murmurs, Normal S2/S2 Respiratory: CTAB, No wheezing or Rales Abdomen: No distension or tenderness Extremities: No edema on extremities   Neuro exam: A&O x 3, PERRL, full strength on all extremities, hearing is normal. Decreased sensation of the left side of the lace to the the neck, shoulder and LUE.  No notable facial droop and patient has normal gait  ASSESSMENT/PLAN:   Left facial numbness Unclear cause of patient's left-sided numbness including her face, left upper and lower extremity. Given acute nature of her presentation and transient left side vision changes believe it is appropriate for prompt imagining. Will send patient to the ED for further evaluation and to rule out intracranial processes.  Discussed need for ED, patient  verbalized understanding and agreeable to plan. -ED triage nurse to inform the patient of the recommend to the ED -Provided patient with work excuse letter     Jerre Simon, MD [facility]

## 2022-12-17 NOTE — ED Triage Notes (Signed)
Pt reports left sided facial numbness since Saturday and left arm "nerve pain" since last Wednesday. Pt has hx of shingles and thinks it is a flair up, pt denies any rash. States she stopped taking her gabapentin on Saturday because she also thought that could be causing her symptoms. No other neuro deficits noted in triage.

## 2022-12-17 NOTE — Patient Instructions (Signed)
It was wonderful to see you today. Thank you for allowing me to be a part of your care. Below is a short summary of what we discussed at your visit today:  Unclear cause of your symptoms at this time.  Your symptoms and recommend that you go to the to get head imaging to rule out serious problems such as stroke.  If you have any questions or concerns, please do not hesitate to contact us via phone or MyChart message.   Jerre Simon, MD [facility]

## 2022-12-17 NOTE — Telephone Encounter (Signed)
Patient calls nurse line regarding concerns for numbness on left side of face, neck, chest, back and shoulder. This has been going on since last Wednesday.   She reports that she had shingles diagnosis in 2021 and has random flair ups. She has been taking Valtrex since last Wednesday, however, has not had improvement in symptoms.   She denies facial drooping, speech difficulty, abnormal gait or weakness.   Scheduled for further evaluation this afternoon.   Discussed ED precautions with patient. She verbalizes understanding.   Veronda Prude, RN

## 2022-12-17 NOTE — ED Provider Notes (Incomplete)
[place] EMERGENCY DEPARTMENT AT [facility] Provider Note   CSN: [id number] Arrival date & time: 12/17/22  1618     History {Add pertinent medical, surgical, social history, OB history to HPI:1} Chief Complaint  Patient presents with   Numbness    Kelly Morton is a 27 y.o. female.  HPI     Home Medications Prior to Admission medications   Medication Sig Start Date End Date Taking? Authorizing Provider  acetaminophen (TYLENOL) 325 MG tablet Take 650 mg by mouth every 6 (six) hours as needed for mild pain.    [provider]  aspirin-acetaminophen-caffeine (EXCEDRIN MIGRAINE) [phone] MG tablet Take 3 tablets by mouth every 6 (six) hours as needed for headache or migraine.     [provider]  escitalopram (LEXAPRO) 10 MG tablet Take 1.5 tablets (15 mg total) by mouth daily. 03/09/22   Alicia Amel, MD  fluticasone (FLONASE) 50 MCG/ACT nasal spray Place 2 sprays into both nostrils daily. 07/11/22   Alicia Amel, MD  hydrOXYzine (ATARAX) 10 MG tablet hydroxyzine HCl 10 mg tablet  TAKE 1/2 TO 2 TABLETS AS NEEDED BY MOUTH UP TO TWICE DAILY FOR ANXIETY    [provider]  ibuprofen (ADVIL) 600 MG tablet Take 600 mg by mouth every 6 (six) hours as needed for moderate pain or cramping.    [provider]  ibuprofen (ADVIL) 600 MG tablet Take 1 tablet (600 mg total) by mouth 2 (two) times daily as needed with food. Patient not taking: Reported on 06/22/2022 04/05/22     metFORMIN (GLUCOPHAGE) 1000 MG tablet Take 1 tablet by mouth 2 times daily. 10/12/22     metFORMIN (GLUCOPHAGE) 500 MG tablet Take 1 tablet by mouth once daily 05/08/22     triamcinolone ointment (KENALOG) 0.1 % Apply topically 2 (two) times daily. 03/09/22   Alicia Amel, MD      Allergies    Fire ant, Bactrim [sulfamethoxazole-trimethoprim], and Elemental sulfur    Review of Systems   Review of Systems  Physical Exam Updated Vital Signs BP (!) 143/89    Pulse 79   Temp 98.3 F (36.8 C) (Oral)   Resp 20   Ht 5\' 8"  (1.727 m)   Wt 91.4 kg   LMP 11/25/2022   SpO2 99%   BMI 30.65 kg/m  Physical Exam  ED Results / Procedures / Treatments   Labs (all labs ordered are listed, but only abnormal results are displayed) Labs Reviewed  PROTIME-INR  APTT  CBC  DIFFERENTIAL  COMPREHENSIVE METABOLIC PANEL  ETHANOL  HCG, SERUM, QUALITATIVE    EKG None  Radiology CT HEAD WO CONTRAST  Result Date: 12/17/2022 CLINICAL DATA:  Acute neurological deficit with stroke suspected. EXAM: CT HEAD WITHOUT CONTRAST TECHNIQUE: Contiguous axial images were obtained from the base of the skull through the vertex without intravenous contrast. RADIATION DOSE REDUCTION: This exam was performed according to the departmental dose-optimization program which includes automated exposure control, adjustment of the mA and/or kV according to patient size and/or use of iterative reconstruction technique. COMPARISON:  CT head 12/14/2019.  MRI brain 12/17/2019 FINDINGS: Brain: No evidence of acute infarction, hemorrhage, hydrocephalus, extra-axial collection or mass lesion/mass effect. Vascular: No hyperdense vessel or unexpected calcification. Skull: Normal. Negative for fracture or focal lesion. Sinuses/Orbits: No acute finding. Other: None. IMPRESSION: No acute intracranial abnormalities. Electronically Signed   By: Burman Nieves M.D.   On: 12/17/2022 17:26    Procedures Procedures  {Document cardiac monitor, telemetry  assessment procedure when appropriate:1}  Medications Ordered in ED Medications  sodium chloride flush (NS) 0.9 % injection 3 mL (has no administration in time range)    ED Course/ Medical Decision Making/ A&P   {   Click here for ABCD2, HEART and other calculatorsREFRESH Note before signing :1}                              Medical Decision Making  ***  {Document critical care time when appropriate:1} {Document review of labs and clinical  decision tools ie heart score, Chads2Vasc2 etc:1}  {Document your independent review of radiology images, and any outside records:1} {Document your discussion with family members, caretakers, and with consultants:1} {Document social determinants of health affecting pt's care:1} {Document your decision making why or why not admission, treatments were needed:1} Final Clinical Impression(s) / ED Diagnoses Final diagnoses:  None    Rx / DC Orders ED Discharge Orders     None

## 2022-12-18 ENCOUNTER — Other Ambulatory Visit (HOSPITAL_COMMUNITY): Payer: Self-pay

## 2022-12-25 DIAGNOSIS — F908 Attention-deficit hyperactivity disorder, other type: Secondary | ICD-10-CM | POA: Diagnosis not present

## 2022-12-31 ENCOUNTER — Telehealth: Payer: Self-pay | Admitting: Internal Medicine

## 2022-12-31 ENCOUNTER — Ambulatory Visit (INDEPENDENT_AMBULATORY_CARE_PROVIDER_SITE_OTHER): Payer: 59 | Admitting: Student

## 2022-12-31 ENCOUNTER — Other Ambulatory Visit (HOSPITAL_COMMUNITY): Payer: Self-pay

## 2022-12-31 VITALS — BP 117/92 | HR 97 | Ht 68.0 in | Wt 200.6 lb

## 2022-12-31 DIAGNOSIS — R2990 Unspecified symptoms and signs involving the nervous system: Secondary | ICD-10-CM

## 2022-12-31 DIAGNOSIS — R269 Unspecified abnormalities of gait and mobility: Secondary | ICD-10-CM

## 2022-12-31 DIAGNOSIS — E876 Hypokalemia: Secondary | ICD-10-CM

## 2022-12-31 MED ORDER — SUMATRIPTAN SUCCINATE 50 MG PO TABS
50.0000 mg | ORAL_TABLET | ORAL | 0 refills | Status: DC | PRN
Start: 2022-12-31 — End: 2023-03-01
  Filled 2022-12-31: qty 18, 30d supply, fill #0

## 2022-12-31 NOTE — Progress Notes (Signed)
    SUBJECTIVE:   CHIEF COMPLAINT / HPI:   Kelly Morton is a 27 year old female here to discuss numbness, gait instability.  Seen at [facility] and subsequently ED on 8/26 with numbness.  She had a burning pain in her left arm going up in her shoulder as well as facial numbness.  CT head was negative.  MRI not indicated at that time as it was not thought to be consistent with MS or other acute demyelinating process.  Since then, she says that she has had gait instability.  She lost her balance and fell. She has been having migraines that are typically on the left side and, with aura of flashing lights, floaters and haziness in her vision. No word or speech finding difficulty.  She has an appointment with neurology scheduled in November, but she is going to get that moved up sooner.  She follows with cardiology for vasovagal syncope, and has an appointment coming up to the dysautonomia clinic.   PERTINENT  PMH / PSH: Migraines, mitral valve prolapse, vasovagal syncope  OBJECTIVE:   BP (!) 117/92   Pulse 97   Ht 5\' 8"  (1.727 m)   Wt 200 lb 9.6 oz (91 kg)   LMP 12/20/2022   SpO2 99%   BMI 30.50 kg/m   General: Pleasant, well-groomed and well-appearing, ambulated independently to clinic HEENT: Pupils PERRLA.  EOMI.  No ptosis. Cardiac: Regular rate and rhythm Respiratory: Normal work of breathing on room air, no wheezing or crackles.  Good air movement throughout. Neuro: Speech clear and fluent.  A and O x 4.  5/5 BL UE and LE strength.  No focal deficits.  ASSESSMENT/PLAN:   Neurological complaint Well-appearing on exam with no focal neurological deficits. Differential of constellation of neurological complaints of numbness, gait instability includes vitamin B12 deficiency, migraine with aura (atypical, complex, hemiplegic), other more malignant conditions such as multiple sclerosis. I believe her symptoms are more likely migraine related, but will defer to neurology.  They may  want further imaging such as MRI.  Reviewed her CT head and lab work from her ER visit. -Check vitamin B12 today, CMP to ensure resolution of hypokalemia -Start sumatriptan as needed for migraine-no contraindications (patient is sexually active with female partner, no concern for pregnancy).  Discussed onset of action, and that this is not a daily medication.  Discussed hydration, Tylenol and ibuprofen as needed as well.  Discussed migraine triggers and what to watch out for. -Follow-up with PCP in the next couple weeks   She currently has FMLA for her vasovagal syncope, filled out by her cardiologist.  Darral Dash, DO [facility]

## 2022-12-31 NOTE — Telephone Encounter (Signed)
Pt calling to make provider aware that FMLA paperwork will be faxed over. Please advise

## 2022-12-31 NOTE — Assessment & Plan Note (Signed)
Well-appearing on exam with no focal neurological deficits. Differential of constellation of neurological complaints of numbness, gait instability includes vitamin B12 deficiency, migraine with aura (atypical, complex, hemiplegic), other more malignant conditions such as multiple sclerosis. I believe her symptoms are more likely migraine related, but will defer to neurology.  They may want further imaging such as MRI.  Reviewed her CT head and lab work from her ER visit. -Check vitamin B12 today, CMP to ensure resolution of hypokalemia -Start sumatriptan as needed for migraine-no contraindications (patient is sexually active with female partner, no concern for pregnancy).  Discussed onset of action, and that this is not a daily medication.  Discussed hydration, Tylenol and ibuprofen as needed as well.  Discussed migraine triggers and what to watch out for. -Follow-up with PCP in the next couple weeks

## 2022-12-31 NOTE — Patient Instructions (Addendum)
So great meeting you today.  Take sumatriptan as needed for severe migraines  Please follow back up with [place] in the next 1 to 2 weeks we can see how you are doing  Will check your vitamin and electrolyte levels today.  I will call you if anything is abnormal  If you have any questions or concerns, please feel free to call the clinic.   Have a wonderful day,  Dr. Darral Dash [facility] [phone]

## 2023-01-01 ENCOUNTER — Other Ambulatory Visit (HOSPITAL_COMMUNITY): Payer: Self-pay

## 2023-01-01 ENCOUNTER — Encounter: Payer: Self-pay | Admitting: Student

## 2023-01-01 ENCOUNTER — Ambulatory Visit (INDEPENDENT_AMBULATORY_CARE_PROVIDER_SITE_OTHER): Payer: 59 | Admitting: Student

## 2023-01-01 VITALS — BP 136/72 | HR 99 | Wt 198.6 lb

## 2023-01-01 DIAGNOSIS — R112 Nausea with vomiting, unspecified: Secondary | ICD-10-CM

## 2023-01-01 DIAGNOSIS — F419 Anxiety disorder, unspecified: Secondary | ICD-10-CM

## 2023-01-01 DIAGNOSIS — R2990 Unspecified symptoms and signs involving the nervous system: Secondary | ICD-10-CM

## 2023-01-01 DIAGNOSIS — E538 Deficiency of other specified B group vitamins: Secondary | ICD-10-CM | POA: Diagnosis not present

## 2023-01-01 DIAGNOSIS — F908 Attention-deficit hyperactivity disorder, other type: Secondary | ICD-10-CM | POA: Diagnosis not present

## 2023-01-01 LAB — BASIC METABOLIC PANEL
BUN/Creatinine Ratio: 12 (ref 9–23)
BUN: 9 mg/dL (ref 6–20)
CO2: 20 mmol/L (ref 20–29)
Calcium: 9.3 mg/dL (ref 8.7–10.2)
Chloride: 102 mmol/L (ref 96–106)
Creatinine, Ser: 0.73 mg/dL (ref 0.57–1.00)
Glucose: 79 mg/dL (ref 70–99)
Potassium: 3.9 mmol/L (ref 3.5–5.2)
Sodium: 138 mmol/L (ref 134–144)
eGFR: 116 mL/min/{1.73_m2} (ref >59)

## 2023-01-01 LAB — VITAMIN B12: Vitamin B-12: 221 pg/mL — ABNORMAL LOW (ref 232–1245)

## 2023-01-01 MED ORDER — ESCITALOPRAM OXALATE 20 MG PO TABS
20.0000 mg | ORAL_TABLET | Freq: Every day | ORAL | 3 refills | Status: DC
Start: 2023-01-01 — End: 2024-02-26
  Filled 2023-01-01: qty 90, 90d supply, fill #0
  Filled 2023-05-18: qty 90, 90d supply, fill #1
  Filled 2023-08-26: qty 90, 90d supply, fill #2
  Filled 2023-11-15: qty 90, 90d supply, fill #3

## 2023-01-01 MED ORDER — METFORMIN HCL 500 MG PO TABS: 500.0000 mg | ORAL_TABLET | Freq: Two times a day (BID) | ORAL | Status: DC

## 2023-01-01 MED ORDER — VITAMIN B-12 1000 MCG PO TABS
1000.0000 ug | ORAL_TABLET | Freq: Every day | ORAL | 0 refills | Status: DC
Start: 2023-01-01 — End: 2023-02-01
  Filled 2023-01-01: qty 30, 30d supply, fill #0

## 2023-01-01 MED ORDER — CYANOCOBALAMIN 1000 MCG/ML IJ SOLN
1000.0000 ug | Freq: Once | INTRAMUSCULAR | Status: AC
Start: 2023-01-01 — End: 2023-01-01
  Administered 2023-01-01: 1000 ug via INTRAMUSCULAR

## 2023-01-01 MED ORDER — ONDANSETRON 4 MG PO TBDP
4.0000 mg | ORAL_TABLET | Freq: Three times a day (TID) | ORAL | 1 refills | Status: DC | PRN
Start: 2023-01-01 — End: 2023-08-01
  Filled 2023-01-01: qty 30, 10d supply, fill #0
  Filled 2023-04-27: qty 30, 10d supply, fill #1

## 2023-01-01 MED ORDER — CYANOCOBALAMIN 1000 MCG/ML IJ SOLN
1000.0000 ug | Freq: Once | INTRAMUSCULAR | Status: DC
Start: 2023-01-01 — End: 2023-01-01

## 2023-01-01 NOTE — Progress Notes (Unsigned)
    SUBJECTIVE:   CHIEF COMPLAINT / HPI:   Gait instability and numbness Seen by Dr. Melissa Noon yesterday for the same. Also ED visit on 8/26. CT head was negative there. No MRI felt necessary at the time. Of note, did have a normal Brain MRI back in 2021 after syncopal episode.  Dr. Melissa Noon felt sx could be complex migraine and therefore started PRN sumatriptan. Also checked a vit B12 which was slightly low at 221. These symptoms have had something of an insidious onset and now have progressed to the point that she has an obviously unsteady and shuffling gait.  Has neurology follow-up coming up this Friday. Also follows with [facility]'s dysautonomia clinic for her hx of syncopal episodes.    OBJECTIVE:   BP 136/72   Pulse 99   Wt 198 lb 9.6 oz (90.1 kg)   LMP 12/20/2022   SpO2 99%   BMI 30.20 kg/m   Gen: In good spirits HENT: Symmetrical facial strength and smile. EOMs intact.  Cardio: RRR, no m/r/g Pulm: Normal WOB on RA, lungs are clear throughout  Neuro: A&O, fluent speech. Shuffling gait, unsteady on feet. Ambulating holding arm of a friend. CN II - visual acuity at baseline, visual fields intact CN III, IV, VI - EOMs intact  CN V - Decreased sensation on L face in all three branches. Good strength to jaw clench CN VII - Symmetric smile, cheek puffing, and eye closing CN VIII - hearing intact, no nystagmus CN IX, X - symmetric palate rise CN XI - strength is symmetric CN XII - tongue movements intact  5/5 Strength in all extremities Brisk Achilles and Patellar reflexes on RLE. 2+ reflexes otherwise   ASSESSMENT/PLAN:   Neurological complaint Differential remains broad. Certainly I am hopeful that this is all due to her B12 deficiency, but the degree of her lab abnormalities does not really corroborate her degree of symptoms. I do believe she needs advanced neuroimaging of the brain and possibly also spinal cord given hyperreflexia of the RLE, but will defer any decisions here to  neurology as she has follow-up later this week. Low suspicion for CNS infection in the absence of any infectious symptoms but an autoimmune encephalopathy cannot be ruled out. She is of the right demographic for this. Also must consider that there is probably a two-way exchange between her anxiety and her neuro symptoms with each one worsening the other. Will need to stay on top of anxiety control as she undergoes workup and treatment.  - B12 IM today and PO daily thereafter - Keep neuro appointment on Friday, defer further imaging/testing to them - Increasing her Lexapro to 20mg  daily - F/u with me in 3-4 weeks. Hopefully will have seen maximal response to B12 supplementation and perhaps had further neuro workup by that time.      Eliezer Mccoy, MD [facility]

## 2023-01-01 NOTE — Patient Instructions (Addendum)
Let's increase your Lexapro to 20mg  daily.  I'm eager to hear what the neurologist has to say and what/if any further imaging they want. My biggest hope is that this is all the B12. We gave you IM today. This has a better absorption than the PO. I DO want you to start taking oral B12 daily until I see you back in 1 month and we'll repeat at that time.   Keep me updated via MyChart. Things I want to know about: marked changes in your symptoms for better or worse, if/how often you're needing the Sumatriptan and if it is working or not.   This is a stressful time, glad you're in therapy, please let me know else I can help.  Eliezer Mccoy, MD

## 2023-01-02 ENCOUNTER — Encounter: Payer: Self-pay | Admitting: Student

## 2023-01-02 ENCOUNTER — Other Ambulatory Visit (HOSPITAL_COMMUNITY): Payer: Self-pay

## 2023-01-03 NOTE — Assessment & Plan Note (Signed)
Differential remains broad. Certainly I am hopeful that this is all due to her B12 deficiency, but the degree of her lab abnormalities does not really corroborate her degree of symptoms. I do believe she needs advanced neuroimaging of the brain and possibly also spinal cord given hyperreflexia of the RLE, but will defer any decisions here to neurology as she has follow-up later this week. Low suspicion for CNS infection in the absence of any infectious symptoms but an autoimmune encephalopathy cannot be ruled out. She is of the right demographic for this. Also must consider that there is probably a two-way exchange between her anxiety and her neuro symptoms with each one worsening the other. Will need to stay on top of anxiety control as she undergoes workup and treatment.  - B12 IM today and PO daily thereafter - Keep neuro appointment on Friday, defer further imaging/testing to them - Increasing her Lexapro to 20mg  daily - F/u with me in 3-4 weeks. Hopefully will have seen maximal response to B12 supplementation and perhaps had further neuro workup by that time.

## 2023-01-04 ENCOUNTER — Encounter: Payer: Self-pay | Admitting: Student

## 2023-01-04 ENCOUNTER — Encounter: Payer: Self-pay | Admitting: Neurology

## 2023-01-04 ENCOUNTER — Other Ambulatory Visit (HOSPITAL_COMMUNITY): Payer: Self-pay

## 2023-01-04 ENCOUNTER — Ambulatory Visit: Payer: 59 | Admitting: Neurology

## 2023-01-04 VITALS — BP 118/81 | HR 96 | Ht 68.0 in | Wt 199.5 lb

## 2023-01-04 DIAGNOSIS — R269 Unspecified abnormalities of gait and mobility: Secondary | ICD-10-CM | POA: Insufficient documentation

## 2023-01-04 DIAGNOSIS — R202 Paresthesia of skin: Secondary | ICD-10-CM

## 2023-01-04 DIAGNOSIS — Z0279 Encounter for issue of other medical certificate: Secondary | ICD-10-CM

## 2023-01-04 MED ORDER — ALPRAZOLAM 1 MG PO TABS
ORAL_TABLET | ORAL | 0 refills | Status: DC
Start: 2023-01-04 — End: 2023-08-15
  Filled 2023-01-04 – 2023-01-14 (×2): qty 3, 1d supply, fill #0

## 2023-01-04 NOTE — Progress Notes (Signed)
Chief Complaint  Patient presents with   New Patient (Initial Visit)    Rm14, alone, NP internal referral for left facial numbness:on 12/15/22 l face got numb & arm left was itching and moved to left side face, saw pcp who sent to ed. Stated today still numb left side and gait abnormality       ASSESSMENT AND PLAN  [name] is a 27 y.o. female   Acute onset left face, arm paresthesia, gait abnormality  Brisk reflex on examination, stiff cautious gait  Need to rule out right hemisphere, cervical and thoracic cord lesion  MRI of the brain, cervical and thoracic with without contrast,  DIAGNOSTIC DATA (LABS, IMAGING, TESTING) - I reviewed patient records, labs, notes, testing and imaging myself where available.   MEDICAL HISTORY:  Kelly Morton is a 27 year old female, seen in request by her primary care physician Dr. Marisue Humble, Fayrene Fearing B for evaluation of left arm facial paresthesia gait abnormality initial evaluation was on January 04, 2023  I reviewed and summarized the referring note. PMHX Anxiety Chronic migraine PCOS  On December 12, 2022, she began to feel left lateral arm paresthesia, tingling, 3 days later on December 15, 2022, she noticed needle prick sensation involving her left face, scalp, chin area,  On December 27, 2022, she felt dizzy, then noticed gait abnormality, which is persistent since then, fell few times,  January 02, 2023, she began to noticed symmetric bilateral plantar feet numbness, denies bowel and bladder incontinence  When she bent down her neck, she felt radiating discomfort from mid back to sacral region  She had a history of left lateral thigh shingle in June 2021, she showed me the picture, rash involving left L4 myotomes then.  She presented to the emergency room December 17, 2022, personally reviewed CT head without contrast was normal Labs showed low B12 221, began to receiving B12 injection, otherwise normal CBC CMP   PHYSICAL  EXAM:   Vitals:   01/04/23 1009 01/04/23 1012  BP: (!) 120/90 118/81  Pulse: 96   Weight: 199 lb 8 oz (90.5 kg)   Height: 5\' 8"  (1.727 m)    Not recorded     Body mass index is 30.33 kg/m.  PHYSICAL EXAMNIATION:  Gen: NAD, conversant, well nourised, well groomed                     Cardiovascular: Regular rate rhythm, no peripheral edema, warm, nontender. Eyes: Conjunctivae clear without exudates or hemorrhage Neck: Supple, no carotid bruits. Pulmonary: Clear to auscultation bilaterally   NEUROLOGICAL EXAM:  MENTAL STATUS: Speech/cognition: Awake, alert, oriented to history taking and casual conversation CRANIAL NERVES: CN II: Visual fields are full to confrontation. Pupils are round equal and briskly reactive to light.  Funduscopic examination with normal bilaterally CN III, IV, VI: extraocular movement are normal. No ptosis. CN V: Facial sensation is intact to light touch CN VII: Face is symmetric with normal eye closure  CN VIII: Hearing is normal to causal conversation. CN IX, X: Phonation is normal. CN XI: Head turning and shoulder shrug are intact  MOTOR: There is no pronator drift of out-stretched arms. Muscle bulk and tone are normal. Muscle strength is normal.  REFLEXES: Reflexes are 2+ and symmetric at the biceps, triceps, 3/3 knees, and ankles. Plantar responses are flexor.  SENSORY: Intact to light touch, pinprick and vibratory sensation are intact in fingers and toes.  COORDINATION: There is no trunk or limb dysmetria noted.  GAIT/STANCE:  Push-up to get up from seated position, stiff, cautious mildly unsteady  REVIEW OF SYSTEMS:  Full 14 system review of systems performed and notable only for as above All other review of systems were negative.   ALLERGIES: Allergies  Allergen Reactions   Fire Ant Anaphylaxis   Bactrim [Sulfamethoxazole-Trimethoprim] Hives   Elemental Sulfur Hives   Sulfur Hives    HOME MEDICATIONS: Current Outpatient  Medications  Medication Sig Dispense Refill   acetaminophen (TYLENOL) 325 MG tablet Take 650 mg by mouth every 6 (six) hours as needed for mild pain.     aspirin-acetaminophen-caffeine (EXCEDRIN MIGRAINE) 250-250-65 MG tablet Take 3 tablets by mouth every 6 (six) hours as needed for headache or migraine.      cyanocobalamin (VITAMIN B12) 1000 MCG tablet Take 1 tablet (1,000 mcg total) by mouth daily. 30 tablet 0   escitalopram (LEXAPRO) 20 MG tablet Take 1 tablet (20 mg total) by mouth daily. 90 tablet 3   fluticasone (FLONASE) 50 MCG/ACT nasal spray Place 2 sprays into both nostrils daily. 16 g 3   hydrOXYzine (ATARAX) 10 MG tablet hydroxyzine HCl 10 mg tablet  TAKE 1/2 TO 2 TABLETS AS NEEDED BY MOUTH UP TO TWICE DAILY FOR ANXIETY     ibuprofen (ADVIL) 600 MG tablet Take 600 mg by mouth every 6 (six) hours as needed for moderate pain or cramping.     metFORMIN (GLUCOPHAGE) 500 MG tablet Take 1 tablet (500 mg total) by mouth 2 (two) times daily with a meal.     ondansetron (ZOFRAN-ODT) 4 MG disintegrating tablet Take 1 tablet (4 mg total) by mouth every 8 (eight) hours as needed for nausea or vomiting. 30 tablet 1   SUMAtriptan (IMITREX) 50 MG tablet Take 1 tablet (50 mg total) by mouth every 2 (two) hours as needed for migraine. May repeat in 2 hours if headache persists or recurs. 20 tablet 0   triamcinolone ointment (KENALOG) 0.1 % Apply topically 2 (two) times daily. 454 g 0   No current facility-administered medications for this visit.    PAST MEDICAL HISTORY: Past Medical History:  Diagnosis Date   H. pylori infection 08/28/2020   Injury of meniscus of left knee 12/16/2021   Ovarian cyst    PTSD (post-traumatic stress disorder)    Sore throat 05/18/2020   Syncope     PAST SURGICAL HISTORY: Past Surgical History:  Procedure Laterality Date   OOPHORECTOMY     TONSILLECTOMY      FAMILY HISTORY: Family History  Problem Relation Age of Onset   Hypertension Mother    Stroke  Mother    Hypertension Father    Cervical cancer Maternal Grandmother    Prostate cancer Maternal Grandfather    Lymphoma Maternal Grandfather    Diabetes Paternal Grandmother     SOCIAL HISTORY: Social History   Socioeconomic History   Marital status: Significant Other    Spouse name: emily (luda)   Number of children: 0   Years of education: Not on file   Highest education level: Bachelor's degree (e.g., BA, AB, BS)  Occupational History   Not on file  Tobacco Use   Smoking status: Never    Passive exposure: Never   Smokeless tobacco: Never  Vaping Use   Vaping status: Never Used  Substance and Sexual Activity   Alcohol use: Yes    Alcohol/week: 7.0 standard drinks of alcohol    Types: 6 Glasses of wine, 1 Cans of beer per week   Drug use: Never  Sexual activity: Yes    Comment: same sex relationship  Other Topics Concern   Not on file  Social History Narrative   Not on file   Social Determinants of Health   Financial Resource Strain: Low Risk  (12/31/2022)   Overall Financial Resource Strain (CARDIA)    Difficulty of Paying Living Expenses: Not very hard  Food Insecurity: No Food Insecurity (12/31/2022)   Hunger Vital Sign    Worried About Running Out of Food in the Last Year: Never true    Ran Out of Food in the Last Year: Never true  Transportation Needs: No Transportation Needs (12/31/2022)   PRAPARE - Administrator, Civil Service (Medical): No    Lack of Transportation (Non-Medical): No  Physical Activity: Insufficiently Active (12/31/2022)   Exercise Vital Sign    Days of Exercise per Week: 2 days    Minutes of Exercise per Session: 30 min  Stress: Stress Concern Present (12/31/2022)   [employer] of Occupational Health - Occupational Stress Questionnaire    Feeling of Stress : Very much  Social Connections: Moderately Isolated (12/31/2022)   Social Connection and Isolation Panel [NHANES]    Frequency of Communication with Friends and Family:  Three times a week    Frequency of Social Gatherings with Friends and Family: Once a week    Attends Religious Services: Never    Database administrator or Organizations: No    Attends Engineer, structural: Not on file    Marital Status: Living with partner  Intimate Partner Violence: Not on file      Levert Feinstein, M.D. Ph.D.  [facility] [address], [address] [place], [place] [address] Ph: [phone] Fax: [phone]  CC:  Eber Hong, MD [address] [place],  [place] [address]  Alicia Amel, MD

## 2023-01-05 ENCOUNTER — Encounter: Payer: Self-pay | Admitting: Student

## 2023-01-05 DIAGNOSIS — E538 Deficiency of other specified B group vitamins: Secondary | ICD-10-CM

## 2023-01-07 MED ORDER — CYANOCOBALAMIN 1000 MCG/ML IJ SOLN
1000.0000 ug | Freq: Once | INTRAMUSCULAR | Status: AC
Start: 2023-01-08 — End: 2023-01-08
  Administered 2023-01-08: 1000 ug via INTRAMUSCULAR

## 2023-01-08 ENCOUNTER — Ambulatory Visit (INDEPENDENT_AMBULATORY_CARE_PROVIDER_SITE_OTHER): Payer: 59

## 2023-01-08 DIAGNOSIS — F908 Attention-deficit hyperactivity disorder, other type: Secondary | ICD-10-CM | POA: Diagnosis not present

## 2023-01-08 DIAGNOSIS — E538 Deficiency of other specified B group vitamins: Secondary | ICD-10-CM

## 2023-01-08 NOTE — Progress Notes (Signed)
Patient presents to nurse clinic for B12 injection. Injection given in LD without complication.  Patient has FU apt scheduled with PCP for 10/10.

## 2023-01-09 ENCOUNTER — Telehealth: Payer: Self-pay | Admitting: Internal Medicine

## 2023-01-09 NOTE — Telephone Encounter (Signed)
Received [employer] FMLA form and payment. Form in Dr. Debby Bud box.

## 2023-01-10 NOTE — Telephone Encounter (Signed)
MD has filled out paperwork left a front desk to follow up.

## 2023-01-10 NOTE — Telephone Encounter (Signed)
Completed form faxed to [employer] and scanned into chart.  Billing notified.

## 2023-01-11 ENCOUNTER — Other Ambulatory Visit: Payer: Self-pay | Admitting: Student

## 2023-01-14 ENCOUNTER — Other Ambulatory Visit (HOSPITAL_COMMUNITY): Payer: Self-pay

## 2023-01-15 ENCOUNTER — Telehealth: Payer: Self-pay

## 2023-01-15 ENCOUNTER — Other Ambulatory Visit (HOSPITAL_COMMUNITY): Payer: Self-pay

## 2023-01-15 ENCOUNTER — Encounter: Payer: Self-pay | Admitting: Neurology

## 2023-01-15 ENCOUNTER — Ambulatory Visit: Payer: 59

## 2023-01-15 DIAGNOSIS — F908 Attention-deficit hyperactivity disorder, other type: Secondary | ICD-10-CM | POA: Diagnosis not present

## 2023-01-15 DIAGNOSIS — E538 Deficiency of other specified B group vitamins: Secondary | ICD-10-CM | POA: Diagnosis not present

## 2023-01-15 MED ORDER — CYANOCOBALAMIN 1000 MCG/ML IJ SOLN
1000.0000 ug | Freq: Once | INTRAMUSCULAR | Status: AC
Start: 2023-01-15 — End: 2023-01-15
  Administered 2023-01-15: 1000 ug via INTRAMUSCULAR

## 2023-01-15 NOTE — Progress Notes (Signed)
Pt is here for a b12 injection today.    Date of last office visit that b12 was discussed 01/01/2023  Last injection was 01/08/2023.   Injection given in RD deltoid, pt tolerated well and scheduled for next dose on 01/23/23.  Veronda Prude, RN

## 2023-01-15 NOTE — Telephone Encounter (Signed)
Patient calls nurse line requesting weekly B12 injection.   She received injections on 9/10 and 9/17.  Spoke with Dr. Marisue Humble. Advised that patient can received 1,000 mcg B12 this week and again next week for a total of 4 weekly doses.   After this, patient should begin oral supplementation, with recheck at next visit on 01/31/23 with Dr. Marisue Humble.   Scheduled patient nurse visit for third B12 injection this morning.   Veronda Prude, RN

## 2023-01-16 ENCOUNTER — Ambulatory Visit (INDEPENDENT_AMBULATORY_CARE_PROVIDER_SITE_OTHER): Payer: 59

## 2023-01-16 DIAGNOSIS — R202 Paresthesia of skin: Secondary | ICD-10-CM | POA: Diagnosis not present

## 2023-01-16 DIAGNOSIS — R269 Unspecified abnormalities of gait and mobility: Secondary | ICD-10-CM

## 2023-01-16 MED ORDER — GADOBENATE DIMEGLUMINE 529 MG/ML IV SOLN
20.0000 mL | Freq: Once | INTRAVENOUS | Status: AC | PRN
  Administered 2023-01-16: 20 mL via INTRAVENOUS

## 2023-01-17 ENCOUNTER — Telehealth (INDEPENDENT_AMBULATORY_CARE_PROVIDER_SITE_OTHER): Payer: 59 | Admitting: Neurology

## 2023-01-17 ENCOUNTER — Other Ambulatory Visit (HOSPITAL_COMMUNITY): Payer: Self-pay

## 2023-01-17 ENCOUNTER — Telehealth: Payer: Self-pay | Admitting: Neurology

## 2023-01-17 DIAGNOSIS — E538 Deficiency of other specified B group vitamins: Secondary | ICD-10-CM

## 2023-01-17 DIAGNOSIS — G43709 Chronic migraine without aura, not intractable, without status migrainosus: Secondary | ICD-10-CM | POA: Insufficient documentation

## 2023-01-17 DIAGNOSIS — G35 Multiple sclerosis: Secondary | ICD-10-CM

## 2023-01-17 MED ORDER — VENLAFAXINE HCL ER 37.5 MG PO CP24
37.5000 mg | ORAL_CAPSULE | Freq: Every day | ORAL | 11 refills | Status: DC
  Filled 2023-01-17: qty 30, 30d supply, fill #0

## 2023-01-17 MED ORDER — RIZATRIPTAN BENZOATE 10 MG PO TBDP
10.0000 mg | ORAL_TABLET | ORAL | 6 refills | Status: DC | PRN
  Filled 2023-01-17: qty 12, 30d supply, fill #0
  Filled 2023-04-02: qty 12, 30d supply, fill #1

## 2023-01-17 NOTE — Progress Notes (Signed)
ASSESSMENT AND PLAN  Kelly Morton is a 27 y.o. female   Relapsing remitting multiple sclerosis,  With active contrast enhancing cerebral, cervical and thoracic spine involvement  Complete laboratory evaluation to rule out mimic,  Lumbar puncture,  Also suggested MS Society website, treatment options, suggested Tysabri, ocrelizumab infusion,  Will return to clinic after lumbar puncture results and the lab result come back  DIAGNOSTIC DATA (LABS, IMAGING, TESTING) - I reviewed patient records, labs, notes, testing and imaging myself where available.   MEDICAL HISTORY:  Kelly Morton is a 27 year old female, seen in request by her primary care physician Dr. Marisue Humble, Fayrene Fearing B for evaluation of left arm facial paresthesia gait abnormality initial evaluation was on January 04, 2023  I reviewed and summarized the referring note. PMHX Anxiety Chronic migraine PCOS  On December 12, 2022, she began to feel left lateral arm paresthesia, tingling, 3 days later on December 15, 2022, she noticed needle prick sensation involving her left face, scalp, chin area,  On December 27, 2022, she felt dizzy, then noticed gait abnormality, which is persistent since then, fell few times,  January 02, 2023, she began to noticed symmetric bilateral plantar feet numbness, denies bowel and bladder incontinence  When she bent down her neck, she felt radiating discomfort from mid back to sacral region  She had a history of left lateral thigh shingle in June 2021, she showed me the picture, rash involving left L4 myotomes then.  She presented to the emergency room December 17, 2022, personally reviewed CT head without contrast was normal Labs showed low B12 221, began to receiving B12 injection, otherwise normal CBC CMP  Virtual Visit via video January 19, 2023 I discussed the limitations of evaluation and management by telemedicine and the availability of in person appointments. The patient  expressed understanding and agreed to proceed  Location: Provider: [facility] office; Patient: Office with her friend  I connected with [name]  on January 19, 2023 by a video enabled telemedicine application and verified that I am speaking with the correct person using two identifiers.  UPDATED HiSTORY She continues to have paresthesia involving lower extremity, left facial paresthesia  MRI showed significant abnormality, we reviewed the film together through virtual share MRI of the brain with without contrast showed multiple T2/FLAIR lesions involving left medulla, cerebellum, bilateral periventricular, periatrial region, corpus callosum, multiple enhancing lesions, MRI of cervical spine showed spinal cord hyperintensity at C2-3 on the left eventually at C5, distant with active demyelinating plaque MRI of the thoracic spine also showed T3-4, T9 with postcontrast enhancement, consistent with active demyelinating plaque  Observations/Objective: I have reviewed problem lists, medications, allergies. Tearful, anxious looking, facial symmetric, no aphasia, no dysarthria moving 4 extremities without difficulties   REVIEW OF SYSTEMS:  Full 14 system review of systems performed and notable only for as above All other review of systems were negative.   ALLERGIES: Allergies  Allergen Reactions   Fire Ant Anaphylaxis   Bactrim [Sulfamethoxazole-Trimethoprim] Hives   Elemental Sulfur Hives   Sulfur Hives    HOME MEDICATIONS: Current Outpatient Medications  Medication Sig Dispense Refill   rizatriptan (MAXALT-MLT) 10 MG disintegrating tablet Take 1 tablet (10 mg total) by mouth as needed. May repeat in 2 hours if needed 12 tablet 6   venlafaxine XR (EFFEXOR XR) 37.5 MG 24 hr capsule Take 1 capsule (37.5 mg total) by mouth daily with breakfast. 30 capsule 11   acetaminophen (TYLENOL) 325 MG tablet Take 650 mg by mouth every 6 (  six) hours as needed for mild pain.     ALPRAZolam  (XANAX) 1 MG tablet Take 1-2 tablets 30 minutes prior to MRI, may repeat once as needed. Must have driver. 3 tablet 0   aspirin-acetaminophen-caffeine (EXCEDRIN MIGRAINE) 250-250-65 MG tablet Take 3 tablets by mouth every 6 (six) hours as needed for headache or migraine.      cyanocobalamin (VITAMIN B12) 1000 MCG tablet Take 1 tablet (1,000 mcg total) by mouth daily. 30 tablet 0   escitalopram (LEXAPRO) 20 MG tablet Take 1 tablet (20 mg total) by mouth daily. 90 tablet 3   fluticasone (FLONASE) 50 MCG/ACT nasal spray Place 2 sprays into both nostrils daily. 16 g 3   hydrOXYzine (ATARAX) 10 MG tablet hydroxyzine HCl 10 mg tablet  TAKE 1/2 TO 2 TABLETS AS NEEDED BY MOUTH UP TO TWICE DAILY FOR ANXIETY     ibuprofen (ADVIL) 600 MG tablet Take 600 mg by mouth every 6 (six) hours as needed for moderate pain or cramping.     metFORMIN (GLUCOPHAGE) 500 MG tablet Take 1 tablet (500 mg total) by mouth 2 (two) times daily with a meal.     ondansetron (ZOFRAN-ODT) 4 MG disintegrating tablet Take 1 tablet (4 mg total) by mouth every 8 (eight) hours as needed for nausea or vomiting. 30 tablet 1   SUMAtriptan (IMITREX) 50 MG tablet Take 1 tablet (50 mg total) by mouth every 2 (two) hours as needed for migraine. May repeat in 2 hours if headache persists or recurs. 20 tablet 0   triamcinolone ointment (KENALOG) 0.1 % Apply topically 2 (two) times daily. 454 g 0   No current facility-administered medications for this visit.    PAST MEDICAL HISTORY: Past Medical History:  Diagnosis Date   H. pylori infection 08/28/2020   Injury of meniscus of left knee 12/16/2021   Ovarian cyst    PTSD (post-traumatic stress disorder)    Sore throat 05/18/2020   Syncope     PAST SURGICAL HISTORY: Past Surgical History:  Procedure Laterality Date   OOPHORECTOMY     TONSILLECTOMY      FAMILY HISTORY: Family History  Problem Relation Age of Onset   Hypertension Mother    Stroke Mother    Hypertension Father     Cervical cancer Maternal Grandmother    Prostate cancer Maternal Grandfather    Lymphoma Maternal Grandfather    Diabetes Paternal Grandmother     SOCIAL HISTORY: Social History   Socioeconomic History   Marital status: Significant Other    Spouse name: emily (luda)   Number of children: 0   Years of education: Not on file   Highest education level: Bachelor's degree (e.g., BA, AB, BS)  Occupational History   Not on file  Tobacco Use   Smoking status: Never    Passive exposure: Never   Smokeless tobacco: Never  Vaping Use   Vaping status: Never Used  Substance and Sexual Activity   Alcohol use: Yes    Alcohol/week: 7.0 standard drinks of alcohol    Types: 6 Glasses of wine, 1 Cans of beer per week   Drug use: Never   Sexual activity: Yes    Comment: same sex relationship  Other Topics Concern   Not on file  Social History Narrative   Not on file   Social Determinants of Health   Financial Resource Strain: Low Risk  (12/31/2022)   Overall Financial Resource Strain (CARDIA)    Difficulty of Paying Living Expenses: Not very hard  Food Insecurity: No Food Insecurity (12/31/2022)   Hunger Vital Sign    Worried About Running Out of Food in the Last Year: Never true    Ran Out of Food in the Last Year: Never true  Transportation Needs: No Transportation Needs (12/31/2022)   PRAPARE - Administrator, Civil Service (Medical): No    Lack of Transportation (Non-Medical): No  Physical Activity: Insufficiently Active (12/31/2022)   Exercise Vital Sign    Days of Exercise per Week: 2 days    Minutes of Exercise per Session: 30 min  Stress: Stress Concern Present (12/31/2022)   [employer] of Occupational Health - Occupational Stress Questionnaire    Feeling of Stress : Very much  Social Connections: Moderately Isolated (12/31/2022)   Social Connection and Isolation Panel [NHANES]    Frequency of Communication with Friends and Family: Three times a week    Frequency of  Social Gatherings with Friends and Family: Once a week    Attends Religious Services: Never    Database administrator or Organizations: No    Attends Engineer, structural: Not on file    Marital Status: Living with partner  Intimate Partner Violence: Not on file      Levert Feinstein, M.D. Ph.D.  [facility] [address], [address] [place], [place] [address] Ph: [phone] Fax: [phone]  CC:  Alicia Amel, MD [address] [place],  [place] [address]  Alicia Amel, MD

## 2023-01-18 ENCOUNTER — Other Ambulatory Visit (INDEPENDENT_AMBULATORY_CARE_PROVIDER_SITE_OTHER): Payer: Self-pay

## 2023-01-18 DIAGNOSIS — Z0289 Encounter for other administrative examinations: Secondary | ICD-10-CM

## 2023-01-18 DIAGNOSIS — G35 Multiple sclerosis: Secondary | ICD-10-CM | POA: Diagnosis not present

## 2023-01-18 DIAGNOSIS — G43709 Chronic migraine without aura, not intractable, without status migrainosus: Secondary | ICD-10-CM | POA: Diagnosis not present

## 2023-01-19 NOTE — Telephone Encounter (Signed)
error 

## 2023-01-20 ENCOUNTER — Encounter: Payer: Self-pay | Admitting: Neurology

## 2023-01-21 ENCOUNTER — Other Ambulatory Visit (HOSPITAL_COMMUNITY): Payer: Self-pay

## 2023-01-21 ENCOUNTER — Other Ambulatory Visit: Payer: Self-pay | Admitting: Neurology

## 2023-01-21 DIAGNOSIS — E559 Vitamin D deficiency, unspecified: Secondary | ICD-10-CM | POA: Insufficient documentation

## 2023-01-21 MED ORDER — VITAMIN D (ERGOCALCIFEROL) 50000 UNITS PO CAPS
50000.0000 [IU] | ORAL_CAPSULE | ORAL | 0 refills | Status: DC
  Filled 2023-01-21: qty 7, 49d supply, fill #0

## 2023-01-23 ENCOUNTER — Encounter: Payer: Self-pay | Admitting: Neurology

## 2023-01-23 ENCOUNTER — Other Ambulatory Visit (HOSPITAL_COMMUNITY): Payer: Self-pay

## 2023-01-23 ENCOUNTER — Ambulatory Visit: Payer: 59 | Admitting: Internal Medicine

## 2023-01-23 ENCOUNTER — Ambulatory Visit (INDEPENDENT_AMBULATORY_CARE_PROVIDER_SITE_OTHER): Payer: 59

## 2023-01-23 DIAGNOSIS — E538 Deficiency of other specified B group vitamins: Secondary | ICD-10-CM

## 2023-01-23 MED ORDER — CYANOCOBALAMIN 1000 MCG/ML IJ SOLN
1000.0000 ug | Freq: Once | INTRAMUSCULAR | Status: AC
Start: 2023-01-23 — End: 2023-01-23
  Administered 2023-01-23: 1000 ug via INTRAMUSCULAR

## 2023-01-23 NOTE — Progress Notes (Signed)
Pt is here for a b12 injection today.     Date of last office visit that b12 was discussed 01/01/2023.   Last injection was 01/15/2023.    Injection given in LD without complications.   Patient has an apt scheduled for 10/10 with PCP for recheck.

## 2023-01-25 LAB — QUANTIFERON-TB GOLD PLUS
QuantiFERON Mitogen Value: >10 [IU]/mL
QuantiFERON Nil Value: 0.02 [IU]/mL
QuantiFERON TB1 Ag Value: 0.01 [IU]/mL
QuantiFERON TB2 Ag Value: 0.01 [IU]/mL
QuantiFERON-TB Gold Plus: NEGATIVE

## 2023-01-25 LAB — HIV ANTIBODY (ROUTINE TESTING W REFLEX): HIV Screen 4th Generation wRfx: NONREACTIVE

## 2023-01-25 LAB — METHYLMALONIC ACID, SERUM: Methylmalonic Acid: 66 nmol/L (ref 0–378)

## 2023-01-25 LAB — MULTIPLE MYELOMA PANEL, SERUM
Albumin SerPl Elph-Mcnc: 4 g/dL (ref 2.9–4.4)
Albumin/Glob SerPl: 1.3 (ref 0.7–1.7)
Alpha 1: 0.3 g/dL (ref 0.0–0.4)
Alpha2 Glob SerPl Elph-Mcnc: 0.7 g/dL (ref 0.4–1.0)
B-Globulin SerPl Elph-Mcnc: 1 g/dL (ref 0.7–1.3)
Gamma Glob SerPl Elph-Mcnc: 1.1 g/dL (ref 0.4–1.8)
Globulin, Total: 3.2 g/dL (ref 2.2–3.9)
IgA/Immunoglobulin A, Serum: 124 mg/dL (ref 87–352)
IgG (Immunoglobin G), Serum: 1114 mg/dL (ref 586–1602)
IgM (Immunoglobulin M), Srm: 112 mg/dL (ref 26–217)
Total Protein: 7.2 g/dL (ref 6.0–8.5)

## 2023-01-25 LAB — ANGIOTENSIN CONVERTING ENZYME: Angio Convert Enzyme: 27 U/L (ref 14–82)

## 2023-01-25 LAB — STRATIFY JCV(TM) AB W/INDEX
JCV Antibody: NEGATIVE
JCV Index Value: 0.17

## 2023-01-25 LAB — C-REACTIVE PROTEIN: CRP: <1 mg/L (ref 0–10)

## 2023-01-25 LAB — HEPATITIS B SURFACE ANTIGEN: Hepatitis B Surface Ag: NEGATIVE

## 2023-01-25 LAB — HEPATITIS B CORE ANTIBODY, TOTAL: Hep B Core Total Ab: NEGATIVE

## 2023-01-25 LAB — HGB A1C W/O EAG: Hgb A1c MFr Bld: 5.5 % (ref 4.8–5.6)

## 2023-01-25 LAB — IRON,TIBC AND FERRITIN PANEL
Ferritin: 9 ng/mL — ABNORMAL LOW (ref 15–150)
Iron Saturation: 12 % — ABNORMAL LOW (ref 15–55)
Iron: 43 ug/dL (ref 27–159)
Total Iron Binding Capacity: 363 ug/dL (ref 250–450)
UIBC: 320 ug/dL (ref 131–425)

## 2023-01-25 LAB — SEDIMENTATION RATE: Sed Rate: 3 mm/h (ref 0–32)

## 2023-01-25 LAB — HEPATITIS B SURFACE ANTIBODY,QUALITATIVE: Hep B Surface Ab, Qual: NONREACTIVE

## 2023-01-25 LAB — ANA W/REFLEX IF POSITIVE: Anti Nuclear Antibody (ANA): NEGATIVE

## 2023-01-25 LAB — FOLATE: Folate: 3.6 ng/mL (ref >3.0)

## 2023-01-25 LAB — HEPATITIS C ANTIBODY: Hep C Virus Ab: NONREACTIVE

## 2023-01-25 LAB — VARICELLA ZOSTER ANTIBODY, IGG: Varicella zoster IgG: REACTIVE

## 2023-01-25 LAB — TSH: TSH: 1.22 u[IU]/mL (ref 0.450–4.500)

## 2023-01-25 LAB — VITAMIN D 25 HYDROXY (VIT D DEFICIENCY, FRACTURES): Vit D, 25-Hydroxy: 16.8 ng/mL — ABNORMAL LOW (ref 30.0–100.0)

## 2023-01-25 LAB — RPR: RPR Ser Ql: NONREACTIVE

## 2023-01-27 ENCOUNTER — Encounter: Payer: Self-pay | Admitting: Student

## 2023-01-27 ENCOUNTER — Other Ambulatory Visit: Payer: Self-pay | Admitting: Student

## 2023-01-28 ENCOUNTER — Other Ambulatory Visit (HOSPITAL_COMMUNITY): Payer: Self-pay

## 2023-01-28 MED ORDER — METFORMIN HCL 500 MG PO TABS
500.0000 mg | ORAL_TABLET | Freq: Two times a day (BID) | ORAL | 3 refills | Status: DC
  Filled 2023-01-28: qty 180, 90d supply, fill #0
  Filled 2023-05-02: qty 180, 90d supply, fill #1
  Filled 2023-07-18: qty 180, 90d supply, fill #2
  Filled 2023-10-24: qty 180, 90d supply, fill #3

## 2023-01-29 ENCOUNTER — Telehealth: Payer: Self-pay | Admitting: Neurology

## 2023-01-29 NOTE — Telephone Encounter (Signed)
Please give her follow-up visit October 9 3:45 PM for discussion of diagnosis of MS, and treatment option  Will have discussion of her extensive laboratory evaluation at that office visit

## 2023-01-30 ENCOUNTER — Ambulatory Visit: Payer: 59 | Admitting: Neurology

## 2023-01-30 ENCOUNTER — Encounter: Payer: Self-pay | Admitting: Neurology

## 2023-01-30 ENCOUNTER — Other Ambulatory Visit (HOSPITAL_COMMUNITY): Payer: Self-pay

## 2023-01-30 VITALS — BP 118/84 | HR 88 | Ht 68.0 in | Wt 193.0 lb

## 2023-01-30 DIAGNOSIS — R269 Unspecified abnormalities of gait and mobility: Secondary | ICD-10-CM

## 2023-01-30 DIAGNOSIS — G43709 Chronic migraine without aura, not intractable, without status migrainosus: Secondary | ICD-10-CM

## 2023-01-30 DIAGNOSIS — G35 Multiple sclerosis: Secondary | ICD-10-CM

## 2023-01-30 DIAGNOSIS — R5383 Other fatigue: Secondary | ICD-10-CM | POA: Diagnosis not present

## 2023-01-30 MED ORDER — NORTRIPTYLINE HCL 10 MG PO CAPS
20.0000 mg | ORAL_CAPSULE | Freq: Every day | ORAL | 11 refills | Status: DC
  Filled 2023-01-30: qty 60, 30d supply, fill #0

## 2023-01-30 MED ORDER — MODAFINIL 100 MG PO TABS
100.0000 mg | ORAL_TABLET | Freq: Two times a day (BID) | ORAL | 5 refills | Status: DC
  Filled 2023-01-30: qty 60, 30d supply, fill #0
  Filled 2023-02-28: qty 60, 30d supply, fill #1
  Filled 2023-04-02: qty 60, 30d supply, fill #2
  Filled 2023-05-02: qty 60, 30d supply, fill #3
  Filled 2023-05-30 – 2023-05-31 (×2): qty 60, 30d supply, fill #4
  Filled 2023-06-26 – 2023-06-28 (×2): qty 60, 30d supply, fill #5

## 2023-01-30 MED ORDER — GABAPENTIN 300 MG PO CAPS
900.0000 mg | ORAL_CAPSULE | Freq: Three times a day (TID) | ORAL | 11 refills | Status: DC
  Filled 2023-01-30: qty 270, 30d supply, fill #0
  Filled 2023-06-16: qty 270, 30d supply, fill #1
  Filled 2023-10-24: qty 270, 30d supply, fill #2

## 2023-01-30 MED ORDER — PREDNISONE 20 MG PO TABS
60.0000 mg | ORAL_TABLET | Freq: Every day | ORAL | 3 refills | Status: DC
  Filled 2023-01-30: qty 90, 30d supply, fill #0

## 2023-01-30 NOTE — Progress Notes (Signed)
ASSESSMENT AND PLAN  Kelly Morton is a 27 y.o. female   Relapsing remitting multiple sclerosis, Gait abnormality  With active contrast enhancing cerebral, cervical and thoracic spine involvement  Laboratory evaluation has ruled out mimics, vitamin D deficiency on supplement, low ferritin level due to heavy menstruation, on iron supplement, Lumbar puncture pending,  After discussion decided on ocrelizumab infusion  IV Solu-Medrol for 3 days, followed by prednisone tapering  Fatigue  Starting modafinil 100 mg twice a day Chronic migraine headaches  Nortriptyline 10 mg titrating to 20 mg every night, Maxalt as needed  DIAGNOSTIC DATA (LABS, IMAGING, TESTING) - I reviewed patient records, labs, notes, testing and imaging myself where available.   MEDICAL HISTORY:  Kelly Morton is a 27 year old female, seen in request by her primary care physician Dr. Marisue Humble, Fayrene Fearing B for evaluation of left arm facial paresthesia gait abnormality initial evaluation was on January 04, 2023  I reviewed and summarized the referring note. PMHX Anxiety Chronic migraine PCOS  On December 12, 2022, she began to feel left lateral arm paresthesia, tingling, 3 days later on December 15, 2022, she noticed needle prick sensation involving her left face, scalp, chin area,  On December 27, 2022, she felt dizzy, then noticed gait abnormality, which is persistent since then, fell few times,  January 02, 2023, she began to noticed symmetric bilateral plantar feet numbness, denies bowel and bladder incontinence  When she bent down her neck, she felt radiating discomfort from mid back to sacral region  She had a history of left lateral thigh shingle in June 2021, she showed me the picture, rash involving left L4 myotomes then.  She presented to the emergency room December 17, 2022, personally reviewed CT head without contrast was normal Labs showed low B12 221, began to receiving B12 injection, otherwise  normal CBC CMP  Virtual Visit via video January 17 2023 I discussed the limitations of evaluation and management by telemedicine and the availability of in person appointments. The patient expressed understanding and agreed to proceed  Location: Provider: [facility] office; Patient: Office with her friend  I connected with [name]  on January 17 2023 by a video enabled telemedicine application and verified that I am speaking with the correct person using two identifiers.  UPDATED HiSTORY She continues to have paresthesia involving lower extremity, left facial paresthesia  MRI showed significant abnormality, we reviewed the film together contrast-enhancement at C2-3 lesion virtual share MRI of the brain with without contrast showed multiple T2/FLAIR lesions involving left medulla, cerebellum, bilateral periventricular, periatrial region, corpus callosum, multiple enhancing lesions, MRI of cervical spine showed spinal cord hyperintensity at C2-3 on the left eventually at C5, distant with active demyelinating plaque MRI of the thoracic spine also showed T3-4, T9 with postcontrast enhancement, consistent with active demyelinating plaque  Update January 30, 2023 Discussed the use of AI scribe software for clinical note transcription with the patient, who gave verbal consent to proceed.  Relapsing, remitting multiple sclerosis, presents for a follow-up visit. They report that their condition has been aggressive, with significant changes over the past three years, including spinal cord involvement. They experience a sensation described as 'awful' when looking down, experience  sign which often leads to migraines. They report daily migraines, which are somewhat managed by Maxalt, a medication that dissolves under the tongue.  The patient also reports heavy menstruation and is currently taking iron supplements daily due to low iron levels. They have been prescribed Vitamin D supplements due to a  deficiency and are advised to continue over-the-counter supplements once the prescription is finished.  The patient has not yet had a lumbar puncture, which is to be scheduled at [facility]. They also report fatigue, which has been the most challenging symptom for them. They live with their fiance and are not considering having children due to polycystic ovary syndrome (PCOS)    Personally reviewed MRI of thoracic spine T3-4, T9 involvement, with contrast-enhancement at T3-4 level  MRI of cervical spine cord signal abnormality at C2-3, C5, subtle contrast-enhancement at C2-3 lesion.  MRI of the brain, MS lesion involving left medullary, upper cervical left cerebellum periventricular, parietal, corpus callosum, enhancing lesions  Extensive laboratory evaluation showed normal or negative CMP, CBC, hepatitis panel, ANA, IgE, protein electrophoresis, vitamin D, folic acid, TSH, C-reactive protein, RPR, varicella-zoster antibody, QuantiFERON TB, JC virus was negative, vitamin D level was 16, ferritin was 9, on supplement    PHYSICAL EXAMNIATION:     01/30/2023    3:56 PM 01/04/2023   10:12 AM 01/04/2023   10:09 AM  Vitals with BMI  Height 5\' 8"   5\' 8"   Weight 193 lbs  199 lbs 8 oz  BMI 29.35  30.34  Systolic 118 118 409  Diastolic 84 81 90  Pulse 88  96     Gen: NAD, conversant, well nourised, well groomed                     Cardiovascular: Regular rate rhythm, no peripheral edema, warm, nontender. Eyes: Conjunctivae clear without exudates or hemorrhage Neck: Supple, no carotid bruits. Pulmonary: Clear to auscultation bilaterally   NEUROLOGICAL EXAM:  MENTAL STATUS: Speech/cognition: Awake, alert oriented to history taking and casual conversation  CRANIAL NERVES: CN II: Visual fields are full to confrontation.  Pupils are round equal and briskly reactive to light. CN III, IV, VI: extraocular movement are normal. No ptosis. CN V: Facial sensation is intact to pinprick in all 3  divisions bilaterally. Corneal responses are intact.  CN VII: Face is symmetric with normal eye closure and smile. CN VIII: Hearing is normal to casual conversation CN IX, X: Palate elevates symmetrically. Phonation is normal. CN XI: Head turning and shoulder shrug are intact CN XII: Tongue is midline with normal movements and no atrophy.  MOTOR: There is no pronator drift of out-stretched arms. Muscle bulk and tone are normal. Muscle strength is normal.  REFLEXES: Reflexes are 3 and symmetric at the biceps, triceps, knees, and ankles. Plantar responses are extensor bilaterally  SENSORY: Intact to light touch, pinprick, positional and vibratory sensation are intact in fingers and toes.  COORDINATION: Rapid alternating movements and fine finger movements are intact. There is no dysmetria on finger-to-nose and heel-knee-shin.    GAIT/STANCE: Rely on her cane, stiff  REVIEW OF SYSTEMS:  Full 14 system review of systems performed and notable only for as above All other review of systems were negative.   ALLERGIES: Allergies  Allergen Reactions   Fire Ant Anaphylaxis   Bactrim [Sulfamethoxazole-Trimethoprim] Hives   Elemental Sulfur Hives   Sulfur Hives    HOME MEDICATIONS: Current Outpatient Medications  Medication Sig Dispense Refill   acetaminophen (TYLENOL) 325 MG tablet Take 650 mg by mouth every 6 (six) hours as needed for mild pain.     ALPRAZolam (XANAX) 1 MG tablet Take 1-2 tablets 30 minutes prior to MRI, may repeat once as needed. Must have driver. 3 tablet 0   aspirin-acetaminophen-caffeine (EXCEDRIN MIGRAINE) 250-250-65 MG tablet Take 3  tablets by mouth every 6 (six) hours as needed for headache or migraine.      cyanocobalamin (VITAMIN B12) 1000 MCG tablet Take 1 tablet (1,000 mcg total) by mouth daily. 30 tablet 0   escitalopram (LEXAPRO) 20 MG tablet Take 1 tablet (20 mg total) by mouth daily. 90 tablet 3   fluticasone (FLONASE) 50 MCG/ACT nasal spray Place 2  sprays into both nostrils daily. 16 g 3   hydrOXYzine (ATARAX) 10 MG tablet hydroxyzine HCl 10 mg tablet  TAKE 1/2 TO 2 TABLETS AS NEEDED BY MOUTH UP TO TWICE DAILY FOR ANXIETY     ibuprofen (ADVIL) 600 MG tablet Take 600 mg by mouth every 6 (six) hours as needed for moderate pain or cramping.     metFORMIN (GLUCOPHAGE) 500 MG tablet Take 1 tablet (500 mg total) by mouth 2 (two) times daily with a meal. 180 tablet 3   ondansetron (ZOFRAN-ODT) 4 MG disintegrating tablet Take 1 tablet (4 mg total) by mouth every 8 (eight) hours as needed for nausea or vomiting. 30 tablet 1   rizatriptan (MAXALT-MLT) 10 MG disintegrating tablet Take 1 tablet (10 mg total) by mouth as needed. May repeat in 2 hours if needed 12 tablet 6   SUMAtriptan (IMITREX) 50 MG tablet Take 1 tablet (50 mg total) by mouth every 2 (two) hours as needed for migraine. May repeat in 2 hours if headache persists or recurs. 20 tablet 0   triamcinolone ointment (KENALOG) 0.1 % Apply topically 2 (two) times daily. 454 g 0   venlafaxine XR (EFFEXOR XR) 37.5 MG 24 hr capsule Take 1 capsule (37.5 mg total) by mouth daily with breakfast. 30 capsule 11   Vitamin D, Ergocalciferol, 50000 units CAPS Take 1 capsule (50,000 Units) by mouth once a week. 7 capsule 0   No current facility-administered medications for this visit.    PAST MEDICAL HISTORY: Past Medical History:  Diagnosis Date   H. pylori infection 08/28/2020   Injury of meniscus of left knee 12/16/2021   Ovarian cyst    PTSD (post-traumatic stress disorder)    Sore throat 05/18/2020   Syncope     PAST SURGICAL HISTORY: Past Surgical History:  Procedure Laterality Date   OOPHORECTOMY     TONSILLECTOMY      FAMILY HISTORY: Family History  Problem Relation Age of Onset   Hypertension Mother    Stroke Mother    Hypertension Father    Cervical cancer Maternal Grandmother    Prostate cancer Maternal Grandfather    Lymphoma Maternal Grandfather    Diabetes Paternal  Grandmother     SOCIAL HISTORY: Social History   Socioeconomic History   Marital status: Significant Other    Spouse name: emily (luda)   Number of children: 0   Years of education: Not on file   Highest education level: Bachelor's degree (e.g., BA, AB, BS)  Occupational History   Not on file  Tobacco Use   Smoking status: Never    Passive exposure: Never   Smokeless tobacco: Never  Vaping Use   Vaping status: Never Used  Substance and Sexual Activity   Alcohol use: Yes    Alcohol/week: 7.0 standard drinks of alcohol    Types: 6 Glasses of wine, 1 Cans of beer per week   Drug use: Never   Sexual activity: Yes    Comment: same sex relationship  Other Topics Concern   Not on file  Social History Narrative   Not on file   Social  Determinants of Health   Financial Resource Strain: Low Risk  (12/31/2022)   Overall Financial Resource Strain (CARDIA)    Difficulty of Paying Living Expenses: Not very hard  Food Insecurity: No Food Insecurity (12/31/2022)   Hunger Vital Sign    Worried About Running Out of Food in the Last Year: Never true    Ran Out of Food in the Last Year: Never true  Transportation Needs: No Transportation Needs (12/31/2022)   PRAPARE - Administrator, Civil Service (Medical): No    Lack of Transportation (Non-Medical): No  Physical Activity: Insufficiently Active (12/31/2022)   Exercise Vital Sign    Days of Exercise per Week: 2 days    Minutes of Exercise per Session: 30 min  Stress: Stress Concern Present (12/31/2022)   [employer] of Occupational Health - Occupational Stress Questionnaire    Feeling of Stress : Very much  Social Connections: Moderately Isolated (12/31/2022)   Social Connection and Isolation Panel [NHANES]    Frequency of Communication with Friends and Family: Three times a week    Frequency of Social Gatherings with Friends and Family: Once a week    Attends Religious Services: Never    Database administrator or  Organizations: No    Attends Engineer, structural: Not on file    Marital Status: Living with partner  Intimate Partner Violence: Not on file      Levert Feinstein, M.D. Ph.D.  [facility] [address] [place], [place] [address] Ph: [phone] Fax: [phone]  CC:  Alicia Amel, MD [address] [place],  [place] [address]  Alicia Amel, MD    Total time spent reviewing the chart, obtaining history, examined patient, ordering tests, documentation, consultations and family, care coordination was 60 minutes

## 2023-01-31 ENCOUNTER — Other Ambulatory Visit (HOSPITAL_COMMUNITY): Payer: Self-pay

## 2023-01-31 ENCOUNTER — Other Ambulatory Visit: Payer: Self-pay

## 2023-01-31 ENCOUNTER — Telehealth: Payer: Self-pay

## 2023-01-31 ENCOUNTER — Other Ambulatory Visit: Payer: Self-pay | Admitting: Student

## 2023-01-31 ENCOUNTER — Encounter: Payer: Self-pay | Admitting: Student

## 2023-01-31 ENCOUNTER — Ambulatory Visit: Payer: 59 | Admitting: Student

## 2023-01-31 VITALS — BP 135/85 | HR 96 | Ht 68.0 in | Wt 196.4 lb

## 2023-01-31 DIAGNOSIS — G35 Multiple sclerosis: Secondary | ICD-10-CM

## 2023-01-31 DIAGNOSIS — Z23 Encounter for immunization: Secondary | ICD-10-CM | POA: Diagnosis not present

## 2023-01-31 NOTE — Progress Notes (Signed)
    SUBJECTIVE:   CHIEF COMPLAINT / HPI:   MS New diagnosis after our last visit. Plugged in with Dr. Terrace Arabia. Getting loaded on Ocrevus and taking 60mg  of prednisone on her non-infusion days. Is also getting modafinil for symptom management of her fatigue. The only outstanding part of her workup now is an LP which is to be done with IR at [facility].  She is adjusting to this new diagnosis, of course, but is pleased to have a diagnosis for the symptoms she was having. She has a friend with MS who has been helping her navigate this new illness landscape. Dr. Terrace Arabia has spoken with her about the importance of vit D supplementation and has written her for 50,000 units weekly.  Health maintenance Flu shot today. Discussed that she may have a slightly diminished immunogenic effect given she got her first dose Ocrevus today, but hopefully we can get some sort of immunogenic response before gets her next two Ocrevus infusions early next week. Pap in scheduled in January with Gyn.   OBJECTIVE:   BP 135/85   Pulse 96   Ht 5\' 8"  (1.727 m)   Wt 196 lb 6.4 oz (89.1 kg)   SpO2 99%   BMI 29.86 kg/m   Gen: In good spirits, generally well-appearing. Ambulating with cane but gait is more stable today compared to my last exam Resp: normal WOB on RA Neuro: Facial numbness has resolved, now with some mild parasthesias to the area, gait improved but still stiff. Remains with LE patellar hyperreflexia but less marked compared to my last exam.   ASSESSMENT/PLAN:   Relapsing remitting multiple sclerosis (HCC) New diagnosis. Adjusting well to what can be a challenging life stressor. Has an excellent support system in [place] and friend who also has MS. We will continue to follow and adjust her antidepressants as needed, but do not see a need to make any changes right now.  - Ocrevus per MS - Does not sound like the plan is for long-term steroids, if she remains on high dose steroids, would consider PCP ppx  - Vit D  supplementation   Health Maintenace Flu shot today. Pap in January with Gyn.   Eliezer Mccoy, MD [facility]

## 2023-01-31 NOTE — Patient Instructions (Signed)
Kelly Morton,  Always great to see you! I'm so glad we have a diagnosis and it sounds like Dr. Terrace Arabia is taking excellent care of you. I do not have much to add today.  We are giving you your flu shot. Because you've already had a dose of Ocrevus, it *may* not be as effective as it may have otherwise but hopefully we can get at least a little bit of an immunogenic response before you have your next infusions next week. Please reach out if there's anything at all I can do for you. Thanks for the sticker! I'll see you and Luda soon,  Eliezer Mccoy, MD

## 2023-01-31 NOTE — Telephone Encounter (Signed)
Faxed to [phone]

## 2023-02-01 ENCOUNTER — Other Ambulatory Visit: Payer: Self-pay | Admitting: Student

## 2023-02-01 ENCOUNTER — Other Ambulatory Visit (HOSPITAL_COMMUNITY): Payer: Self-pay

## 2023-02-01 ENCOUNTER — Encounter: Payer: Self-pay | Admitting: Student

## 2023-02-01 DIAGNOSIS — E538 Deficiency of other specified B group vitamins: Secondary | ICD-10-CM

## 2023-02-01 MED ORDER — VITAMIN B-12 1000 MCG PO TABS
1000.0000 ug | ORAL_TABLET | Freq: Every day | ORAL | 3 refills | Status: DC
Start: 2023-02-01 — End: 2024-02-26
  Filled 2023-02-01: qty 90, 90d supply, fill #0
  Filled 2023-08-26: qty 90, 90d supply, fill #1
  Filled 2023-12-04 (×2): qty 90, 90d supply, fill #2

## 2023-02-01 MED ORDER — IBUPROFEN 600 MG PO TABS
600.0000 mg | ORAL_TABLET | Freq: Two times a day (BID) | ORAL | 1 refills | Status: DC | PRN
  Filled 2023-02-01: qty 30, 15d supply, fill #0

## 2023-02-01 MED ORDER — IBUPROFEN 600 MG PO TABS
600.0000 mg | ORAL_TABLET | Freq: Four times a day (QID) | ORAL | 3 refills | Status: DC | PRN
  Filled 2023-02-01 – 2023-10-24 (×2): qty 90, 23d supply, fill #0

## 2023-02-01 NOTE — Assessment & Plan Note (Signed)
New diagnosis. Adjusting well to what can be a challenging life stressor. Has an excellent support system in [place] and friend who also has MS. We will continue to follow and adjust her antidepressants as needed, but do not see a need to make any changes right now.  - Ocrevus per MS - Does not sound like the plan is for long-term steroids, if she remains on high dose steroids, would consider PCP ppx  - Vit D supplementation

## 2023-02-04 ENCOUNTER — Telehealth (HOSPITAL_COMMUNITY): Payer: Self-pay

## 2023-02-04 DIAGNOSIS — G35 Multiple sclerosis: Secondary | ICD-10-CM | POA: Diagnosis not present

## 2023-02-04 DIAGNOSIS — F908 Attention-deficit hyperactivity disorder, other type: Secondary | ICD-10-CM | POA: Diagnosis not present

## 2023-02-04 NOTE — Telephone Encounter (Signed)
Called to schedule lumbar puncture, no answer, left vm. AB

## 2023-02-05 ENCOUNTER — Encounter: Payer: Self-pay | Admitting: Neurology

## 2023-02-05 ENCOUNTER — Other Ambulatory Visit: Payer: Self-pay

## 2023-02-05 ENCOUNTER — Telehealth: Payer: Self-pay

## 2023-02-05 ENCOUNTER — Telehealth: Payer: Self-pay | Admitting: Neurology

## 2023-02-05 DIAGNOSIS — G35 Multiple sclerosis: Secondary | ICD-10-CM

## 2023-02-05 NOTE — Telephone Encounter (Signed)
Received paperwork to be completed in Pod

## 2023-02-05 NOTE — Telephone Encounter (Signed)
I met patient during intra fusion, third day of IV Solu-Medrol 1000 mg, she is doing much better, tolerating treatment, ambulate without a cane now  This will be followed by prednisone tapering, 60 mg for 3 days, then 40 mg for 3 days, 20 mg for 3 days, then 10 mg for 3 days  Lumbar puncture pending  Return to clinic with nurse practitioner in 6 months

## 2023-02-05 NOTE — Telephone Encounter (Signed)
Paperwork completed and placed in the MD office for review

## 2023-02-06 ENCOUNTER — Telehealth: Payer: Self-pay

## 2023-02-06 ENCOUNTER — Telehealth: Payer: Self-pay | Admitting: *Deleted

## 2023-02-06 LAB — IGG, IGA, IGM
IgA/Immunoglobulin A, Serum: 123 mg/dL (ref 87–352)
IgG (Immunoglobin G), Serum: 1062 mg/dL (ref 586–1602)
IgM (Immunoglobulin M), Srm: 117 mg/dL (ref 26–217)

## 2023-02-06 NOTE — Telephone Encounter (Signed)
Mychart message sent about ocrevus consent

## 2023-02-06 NOTE — Telephone Encounter (Signed)
Pt matrix form faxed on 02/06/2023

## 2023-02-07 ENCOUNTER — Telehealth: Payer: Self-pay

## 2023-02-07 NOTE — Telephone Encounter (Signed)
Faxed ocrevus start form to [employer]

## 2023-02-12 DIAGNOSIS — F908 Attention-deficit hyperactivity disorder, other type: Secondary | ICD-10-CM | POA: Diagnosis not present

## 2023-02-12 NOTE — Telephone Encounter (Signed)
Start form faxed for ocrevus

## 2023-02-13 ENCOUNTER — Other Ambulatory Visit (HOSPITAL_COMMUNITY): Payer: Self-pay | Admitting: Student

## 2023-02-15 ENCOUNTER — Telehealth: Payer: Self-pay | Admitting: Neurology

## 2023-02-15 ENCOUNTER — Other Ambulatory Visit: Payer: Self-pay | Admitting: Neurology

## 2023-02-15 ENCOUNTER — Ambulatory Visit (HOSPITAL_COMMUNITY)
Admission: RE | Admit: 2023-02-15 | Discharge: 2023-02-15 | Disposition: A | Discharge status: Home / Self Care | Payer: 59 | Source: Ambulatory Visit | Attending: Neurology | Admitting: Neurology

## 2023-02-15 ENCOUNTER — Other Ambulatory Visit: Payer: Self-pay

## 2023-02-15 VITALS — BP 119/83 | HR 69 | Temp 98.3°F | Resp 17 | Ht 68.0 in | Wt 189.0 lb

## 2023-02-15 DIAGNOSIS — G35 Multiple sclerosis: Secondary | ICD-10-CM

## 2023-02-15 DIAGNOSIS — R569 Unspecified convulsions: Secondary | ICD-10-CM

## 2023-02-15 DIAGNOSIS — G43709 Chronic migraine without aura, not intractable, without status migrainosus: Secondary | ICD-10-CM

## 2023-02-15 DIAGNOSIS — R200 Anesthesia of skin: Secondary | ICD-10-CM | POA: Diagnosis not present

## 2023-02-15 HISTORY — PX: IR LUMBAR PUNCTURE: IMG944

## 2023-02-15 LAB — CSF CELL COUNT WITH DIFFERENTIAL
RBC Count, CSF: 2750 /mm3 — ABNORMAL HIGH
Tube #: 3
WBC, CSF: 9 /mm3 — ABNORMAL HIGH (ref 0–5)

## 2023-02-15 LAB — PROTEIN, CSF: Total  Protein, CSF: 21 mg/dL (ref 15–45)

## 2023-02-15 LAB — PREGNANCY, URINE: Preg Test, Ur: NEGATIVE

## 2023-02-15 LAB — GLUCOSE, CSF: Glucose, CSF: 52 mg/dL (ref 40–70)

## 2023-02-15 MED ORDER — FENTANYL CITRATE (PF) 100 MCG/2ML IJ SOLN
INTRAMUSCULAR | Status: AC
  Filled 2023-02-15: qty 2

## 2023-02-15 MED ORDER — MIDAZOLAM HCL 2 MG/2ML IJ SOLN
INTRAMUSCULAR | Status: AC
  Filled 2023-02-15: qty 2

## 2023-02-15 MED ORDER — ACETAMINOPHEN 500 MG PO TABS: 1000.0000 mg | ORAL_TABLET | Freq: Four times a day (QID) | ORAL | Status: DC | PRN

## 2023-02-15 MED ORDER — FENTANYL CITRATE (PF) 100 MCG/2ML IJ SOLN
INTRAMUSCULAR | Status: AC | PRN
  Administered 2023-02-15 (×2): 50 ug via INTRAVENOUS

## 2023-02-15 MED ORDER — SODIUM CHLORIDE 0.9 % IV SOLN: INTRAVENOUS | Status: DC

## 2023-02-15 MED ORDER — MIDAZOLAM HCL 2 MG/2ML IJ SOLN
INTRAMUSCULAR | Status: AC | PRN
Start: 2023-02-15 — End: 2023-02-15
  Administered 2023-02-15 (×2): 1 mg via INTRAVENOUS

## 2023-02-15 NOTE — Progress Notes (Signed)
Tube 1. Total protein and Glucose Tube 2: Gram stain, fungal stain, VDRL Tube 3:  MS panel Tube 4: Cell count and differentiations.

## 2023-02-15 NOTE — H&P (Signed)
Chief Complaint: Patient was seen in consultation today for lumbar puncture   Referring Physician(s): Yan,Yijun  Supervising Physician: Mir, Mauri Reading  Patient Status: [facility] - Out-pt  History of Present Illness: Kelly Morton is a 27 y.o. female with a medical history significant for migraines, psoriasis, vasovagal syncope, shingles and newly diagnosed relapsing remitting multiple sclerosis. She presented to the [facility] ED in August 2024 with complaints of burning pain in her left arm that extended to her shoulder. She also had complaints of numbness in the left side of her face and head. Work up in the ED was reassuring and she was discharged with outpatient neurology follow up. She underwent MR imaging in September which showed findings consistent with relapsing remitting multiple sclerosis. A lumbar puncture is required for additional work up.   Interventional Radiology has been asked to evaluate this patient for an image-guided lumbar puncture under moderate sedation.   Past Medical History:  Diagnosis Date   H. pylori infection 08/28/2020   Injury of meniscus of left knee 12/16/2021   Ovarian cyst    PTSD (post-traumatic stress disorder)    Sore throat 05/18/2020   Syncope     Past Surgical History:  Procedure Laterality Date   OOPHORECTOMY     TONSILLECTOMY      Allergies: Fire ant, Bactrim [sulfamethoxazole-trimethoprim], Elemental sulfur, and Sulfur  Medications: Prior to Admission medications   Medication Sig Start Date End Date Taking? Authorizing Provider  acetaminophen (TYLENOL) 325 MG tablet Take 650 mg by mouth every 6 (six) hours as needed for mild pain.    [provider]  ALPRAZolam Prudy Feeler) 1 MG tablet Take 1-2 tablets 30 minutes prior to MRI, may repeat once as needed. Must have driver. 01/04/23   Levert Feinstein, MD  aspirin-acetaminophen-caffeine (EXCEDRIN MIGRAINE) [phone] MG tablet Take 3 tablets by mouth every 6 (six) hours as needed for  headache or migraine.     [provider]  cyanocobalamin (VITAMIN B12) 1000 MCG tablet Take 1 tablet (1,000 mcg total) by mouth daily. 02/01/23   Alicia Amel, MD  escitalopram (LEXAPRO) 20 MG tablet Take 1 tablet (20 mg total) by mouth daily. 01/01/23   Alicia Amel, MD  fluticasone (FLONASE) 50 MCG/ACT nasal spray Place 2 sprays into both nostrils daily. 07/11/22   Alicia Amel, MD  gabapentin (NEURONTIN) 300 MG capsule Take 3 capsules (900 mg total) by mouth 3 (three) times daily. 01/30/23   Levert Feinstein, MD  hydrOXYzine (ATARAX) 10 MG tablet hydroxyzine HCl 10 mg tablet  TAKE 1/2 TO 2 TABLETS AS NEEDED BY MOUTH UP TO TWICE DAILY FOR ANXIETY    [provider]  ibuprofen (ADVIL) 600 MG tablet Take 1 tablet (600 mg total) by mouth every 6 (six) hours as needed for moderate pain or cramping. 02/01/23   Alicia Amel, MD  ibuprofen (ADVIL) 600 MG tablet Take 1 tablet (600 mg total) by mouth 2 (two) times daily as needed with food 02/01/23     metFORMIN (GLUCOPHAGE) 500 MG tablet Take 1 tablet (500 mg total) by mouth 2 (two) times daily with a meal. 01/28/23   Alicia Amel, MD  modafinil (PROVIGIL) 100 MG tablet Take 1 tablet (100 mg total) by mouth 2 (two) times daily. 01/30/23   Levert Feinstein, MD  ondansetron (ZOFRAN-ODT) 4 MG disintegrating tablet Take 1 tablet (4 mg total) by mouth every 8 (eight) hours as needed for nausea or vomiting. 01/01/23   Alicia Amel, MD  predniSONE (  DELTASONE) 20 MG tablet Take 3 tablets (60 mg total) by mouth daily. 01/30/23   Levert Feinstein, MD  rizatriptan (MAXALT-MLT) 10 MG disintegrating tablet Take 1 tablet (10 mg total) by mouth as needed. May repeat in 2 hours if needed 01/17/23   Levert Feinstein, MD  SUMAtriptan (IMITREX) 50 MG tablet Take 1 tablet (50 mg total) by mouth every 2 (two) hours as needed for migraine. May repeat in 2 hours if headache persists or recurs. 12/31/22   Dameron, Nolberto Hanlon, DO  triamcinolone ointment (KENALOG) 0.1 % Apply  topically 2 (two) times daily. 03/09/22   Alicia Amel, MD  Vitamin D, Ergocalciferol, 50000 units CAPS Take 1 capsule (50,000 Units) by mouth once a week. 01/21/23   Levert Feinstein, MD  nortriptyline (PAMELOR) 10 MG capsule Take 2 capsules (20 mg total) by mouth at bedtime. 01/30/23 02/01/23  Levert Feinstein, MD     Family History  Problem Relation Age of Onset   Hypertension Mother    Stroke Mother    Hypertension Father    Cervical cancer Maternal Grandmother    Prostate cancer Maternal Grandfather    Lymphoma Maternal Grandfather    Diabetes Paternal Grandmother     Social History   Socioeconomic History   Marital status: Significant Other    Spouse name: emily (luda)   Number of children: 0   Years of education: Not on file   Highest education level: Bachelor's degree (e.g., BA, AB, BS)  Occupational History   Not on file  Tobacco Use   Smoking status: Never    Passive exposure: Never   Smokeless tobacco: Never  Vaping Use   Vaping status: Never Used  Substance and Sexual Activity   Alcohol use: Yes    Alcohol/week: 7.0 standard drinks of alcohol    Types: 6 Glasses of wine, 1 Cans of beer per week   Drug use: Never   Sexual activity: Yes    Comment: same sex relationship  Other Topics Concern   Not on file  Social History Narrative   Not on file   Social Determinants of Health   Financial Resource Strain: Low Risk  (12/31/2022)   Overall Financial Resource Strain (CARDIA)    Difficulty of Paying Living Expenses: Not very hard  Food Insecurity: No Food Insecurity (12/31/2022)   Hunger Vital Sign    Worried About Running Out of Food in the Last Year: Never true    Ran Out of Food in the Last Year: Never true  Transportation Needs: No Transportation Needs (12/31/2022)   PRAPARE - Administrator, Civil Service (Medical): No    Lack of Transportation (Non-Medical): No  Physical Activity: Insufficiently Active (12/31/2022)   Exercise Vital Sign    Days of  Exercise per Week: 2 days    Minutes of Exercise per Session: 30 min  Stress: Stress Concern Present (12/31/2022)   [employer] of Occupational Health - Occupational Stress Questionnaire    Feeling of Stress : Very much  Social Connections: Moderately Isolated (12/31/2022)   Social Connection and Isolation Panel [NHANES]    Frequency of Communication with Friends and Family: Three times a week    Frequency of Social Gatherings with Friends and Family: Once a week    Attends Religious Services: Never    Database administrator or Organizations: No    Attends Engineer, structural: Not on file    Marital Status: Living with partner    Review of Systems: A  12 point ROS discussed and pertinent positives are indicated in the HPI above.  All other systems are negative.  Review of Systems  Constitutional:  Negative for appetite change and fatigue.  Respiratory:  Negative for cough and shortness of breath.   Cardiovascular:  Negative for chest pain and leg swelling.  Gastrointestinal:  Negative for abdominal pain, diarrhea, nausea and vomiting.  Genitourinary:  Negative for flank pain.  Musculoskeletal:  Negative for back pain.  Neurological:  Negative for dizziness and headaches.    Vital Signs: BP (!) 129/94   Pulse 95   Temp 98.3 F (36.8 C) (Oral)   Resp 16   Ht 5\' 8"  (1.727 m)   Wt 189 lb (85.7 kg)   LMP 02/07/2023 (Exact Date)   SpO2 98%   BMI 28.74 kg/m   Physical Exam Constitutional:      General: She is not in acute distress.    Appearance: She is not ill-appearing.  HENT:     Mouth/Throat:     Mouth: Mucous membranes are moist.     Pharynx: Oropharynx is clear.  Cardiovascular:     Pulses: Normal pulses.  Pulmonary:     Effort: Pulmonary effort is normal.  Abdominal:     Tenderness: There is no abdominal tenderness.  Skin:    General: Skin is warm and dry.  Neurological:     Mental Status: She is alert and oriented to person, place, and time.      Imaging: MR THORACIC SPINE W WO CONTRAST  Result Date: 01/17/2023  [facility] [address], [address] [place], [place] [address] [phone] NEUROIMAGING REPORT STUDY DATE: 01/16/2023 PATIENT NAME: Jerriah Troyan DOB: 03-06-1996 MRN: [id number] ORDERING CLINICIAN: Dr. Terrace Arabia CLINICAL HISTORY: 27 year old old patient being evaluated for numbness and gait abnormality COMPARISON FILMS: None EXAM: MRI thoracic spine with and without contrast TECHNIQUE: Sagittal T1, T2, STIR, axial T1, T2 and postcontrast sagittal and axial T1 images were obtained through thoracic spine CONTRAST: 20 mL IV MultiHance IMAGING SITE: [facility] imaging at [address] FINDINGS: The thoracic vertebrae demonstrate normal alignment, body height and bone marrow signal characteristics.  The intervertebral discs do not show significant disc degeneration, herniation or significant root or foraminal encroachment.  The spinal cord parenchyma shows ill-defined hyperintensities at T3/T4 and at T9 which may represent remote age demyelinating plaques.  Postcontrast images show enhancement of the lesion at T9 and at T3-4 suggesting active disease.   MRI scan of thoracic spine with and without contrast showing no significant disc abnormalities or compression.  Spinal cord hyperintensities at T3-4 and at T9 with postcontrast enhancement indicate active demyelinating plaques. INTERPRETING PHYSICIAN: Delia Heady, MD Certified in  Neuroimaging by American Society of Neuroimaging and [employer] Council for Neurological Subspecialities  MR CERVICAL SPINE W WO CONTRAST  Result Date: 01/17/2023  [facility] [address], [address] [place], [place] [address] [phone] NEUROIMAGING REPORT STUDY DATE: 01/16/2023 PATIENT NAME: Temperance Kundrat DOB: 1995/08/21 MRN: [id number] ORDERING CLINICIAN: Dr. Terrace Arabia CLINICAL HISTORY: 27 year old patient being evaluated for numbness and gait abnormality COMPARISON FILMS: None available EXAM:  MRI cervical spine with and without contrast TECHNIQUE: Sagittal T1, T2, STIR, axial T2, GRE and postcontrast axial and sagittal T1 images were obtained through the brain CONTRAST: 20 mL IV multi hands IMAGING SITE: [facility] edging at [address] FINDINGS: The cervical vertebrae demonstrate loss of forward lordotic curvature with normal body height and bone marrow signal characteristics.  The intervertebral discs show normal signal without frank discrimination,  cord compression, or foraminal encroachment. The spinal cord parenchyma shows ill-defined spinal cord hyperintensity to the left at C2-3 and a prominent hyperintensity anteriorly at C5 which shows prominent postcontrast enhancement.  Is also subtle enhancement of the C2- 3 lesion.  Visualized portion of the thoracic spine shows no abnormalities.  The visualized portion of the lower brainstem also show hyperintensity within the medulla and craniovertebral junction.   MRI cervical spine with and without contrast showing no significant spinal stenosis or compression.  Spinal cord hyperintensity  at C2-3 on the left and ventrally at C5 represent   active demyelinating plaques INTERPRETING PHYSICIAN: PRAMOD SETHI, MD Certified in  Neuroimaging by American Society of Neuroimaging and [employer] for Neurological Subspecialities  MR BRAIN W WO CONTRAST  Result Date: 01/17/2023  [facility] [address] [place], [place] [address] [phone] NEUROIMAGING REPORT STUDY DATE: 01/16/2023 PATIENT NAME: Cailani Adamik DOB: 12/19/1995 MRN: [id number] ORDERING CLINICIAN: Dr. Terrace Arabia CLINICAL HISTORY: 27 year old patient being evaluated for paresthesias and gait abnormality COMPARISON FILMS: CT head 12/17/2022 EXAM: MRI brain with and without contrast TECHNIQUE: Sagittal T1, FLAIR, axial T1, T2, FLAIR, DWI, ADC map, GRE, coronal T2 and postcontrast axial and coronal T1 nasal obtained through the brain CONTRAST: 20 mL IV MultiHance IMAGING SITE: [facility]  imaging at [address] FINDINGS: The brain parenchyma shows multiple T2/FLAIR lesions involving the left medulla, inferior cerebellar peduncle, left cerebellum, bilateral temporal periventricular region, right posterior lentiform nucleus, bilateral periatrial, corpus callosum and pericallosal lesions as well as upper cervical spine C2/C3 spinal cord parenchyma lesion in a pattern and distribution compatible with demyelinating disease.  Several of these show significant postcontrast enhancement including left cerebellum, left medulla, bilateral temporal periventricular and left posterior frontal white matter suggesting active disease.  Few T1 black holes are noted.  No significant atrophy of the cortex of corpus callosum noted.  Small 3 mm benign pineal region cyst is noted.  Diffusion-weighted imaging negative for acute ischemia.  GRE sequences do not show significant microhemorrhages.  Extra-axial brain structures are normal.  Calvarium shows no abnormalities.  Orbits appear unremarkable.  Pituitary gland and cerebellar tonsils appear normal.  Visualized portion of the cervical spine also shows ill-defined spinal cord hyperintensity at C2-3.  Flow-voids are large vessel intracranial circulation appear to be patent.  Postcontrast images show multiple enhancing lesions described above.   MRI scan of the brain with and without contrast showing multiple T2/FLAIR lesions involving left medulla, upper cervical spinal cord, left cerebellum, bilateral periventricular, periatrial regions and corpus callosum in a pattern and distribution compatible with active demyelinating disease.  Multiple enhancing lesions also noted as described above. INTERPRETING PHYSICIAN: PRAMOD SETHI, MD Certified in  Neuroimaging by American Society of Neuroimaging and [place] [employer] for Neurological Subspecialities   Labs:  CBC: Recent Labs    12/17/22 1708  WBC 8.0  HGB 12.2  HCT 38.7  PLT 297    COAGS: Recent Labs     12/17/22 1708  INR 0.9  APTT 26    BMP: Recent Labs    12/17/22 1708 12/31/22 1654  NA 134* 138  K 3.3* 3.9  CL 104 102  CO2 20* 20  GLUCOSE 89 79  BUN 7 9  CALCIUM 8.9 9.3  CREATININE 0.68 0.73  GFRNONAA >60  --     LIVER FUNCTION TESTS: Recent Labs    12/17/22 1708 01/18/23 1048  BILITOT 0.7  --   AST 15  --   ALT 16  --  ALKPHOS 45  --   PROT 7.5 7.2  ALBUMIN 4.3  --     TUMOR MARKERS: No results for input(s): "AFPTM", "CEA", "CA199", "CHROMGRNA" in the last 8760 hours.  Assessment and Plan:  Relapsing remitting multiple sclerosis: Kelly Morton, 27 year old female, presents today to the [facility] Interventional Radiology department for an image-guided lumbar puncture under moderate sedation.   The risks (including hemorrhage, infection, and nerve damage, among others), benefits, and alternatives to fluoroscopically guided lumbar puncture with the patient. The risk of post-procedure headache was discussed.   She has been NPO. Consent signed and on the chart.   Thank you for this interesting consult.  I greatly enjoyed meeting Kelly Morton and look forward to participating in their care.  A copy of this report was sent to the requesting provider on this date.  Electronically Signed: Alwyn Ren, AGACNP-BC [phone] 02/15/2023, 1:08 PM   I spent a total of  30 Minutes   in face to face in clinical consultation, greater than 50% of which was counseling/coordinating care for lumbar puncture under moderate sedation.

## 2023-02-18 LAB — FUNGAL STAIN REFLEX

## 2023-02-18 LAB — VDRL, CSF: VDRL Quant, CSF: NONREACTIVE

## 2023-02-18 LAB — FUNGUS STAIN

## 2023-02-18 NOTE — Telephone Encounter (Signed)
Orders Placed This Encounter  Procedures  . DG FL GUIDED LUMBAR PUNCTURE   

## 2023-02-19 ENCOUNTER — Telehealth: Payer: Self-pay | Admitting: Internal Medicine

## 2023-02-19 LAB — CSF CULTURE W GRAM STAIN
Culture: NO GROWTH
Gram Stain: NONE SEEN

## 2023-02-19 NOTE — Telephone Encounter (Signed)
Called pt to f/u call from asking if pt sees cardiology.  Pt reports Kelly Morton called her advised that she may be following up on the referral we placed for Kelly Morton.  Asked Kelly Morton with front desk  reports would have to check chart to see.  Pt reports was recently dx with MS wants to let Kelly Morton know.

## 2023-02-19 NOTE — Telephone Encounter (Signed)
Patient states she is returning a call from an [place] who is asking if she has ever been seen by cardiology previously. Please advise.

## 2023-02-28 ENCOUNTER — Other Ambulatory Visit: Payer: Self-pay | Admitting: Neurology

## 2023-02-28 ENCOUNTER — Other Ambulatory Visit: Payer: Self-pay

## 2023-02-28 ENCOUNTER — Other Ambulatory Visit (HOSPITAL_COMMUNITY): Payer: Self-pay

## 2023-02-28 DIAGNOSIS — F908 Attention-deficit hyperactivity disorder, other type: Secondary | ICD-10-CM | POA: Diagnosis not present

## 2023-02-28 MED ORDER — VITAMIN D (ERGOCALCIFEROL) 50000 UNITS PO CAPS
50000.0000 [IU] | ORAL_CAPSULE | ORAL | 0 refills | Status: DC
  Filled 2023-02-28 – 2023-03-03 (×2): qty 7, 49d supply, fill #0

## 2023-03-01 ENCOUNTER — Ambulatory Visit: Payer: 59 | Attending: Internal Medicine | Admitting: Internal Medicine

## 2023-03-01 ENCOUNTER — Encounter: Payer: Self-pay | Admitting: Internal Medicine

## 2023-03-01 VITALS — BP 112/70 | HR 113 | Ht 68.0 in | Wt 190.0 lb

## 2023-03-01 DIAGNOSIS — I341 Nonrheumatic mitral (valve) prolapse: Secondary | ICD-10-CM

## 2023-03-01 DIAGNOSIS — R55 Syncope and collapse: Secondary | ICD-10-CM | POA: Diagnosis not present

## 2023-03-01 DIAGNOSIS — I340 Nonrheumatic mitral (valve) insufficiency: Secondary | ICD-10-CM

## 2023-03-01 NOTE — Progress Notes (Signed)
Cardiology Office Note:  .    Date:  03/01/2023  ID:  Kelly Morton, DOB [birth year], MRN [id number] PCP: Alicia Amel, MD  [facility] Providers Cardiologist:  Christell Constant, MD     CC: Follow up syncope  History of Present Illness: .    Kelly Morton is a 27 y.o. female PTSD, Chronic Migraine, and Syncope.  She has had MAD found in 2022.  Low risk CMR from 2023. Prior to her diagnosis of MS we were performing her FMLA paperwork.  Discussed the use of AI scribe software for clinical note transcription with the patient, who gave verbal consent to proceed.  [name] is a 27 year old female with a history of mitral aneurysm disjunction, with concerns for dysautonomia, and vasovagal syncope. Recently, she was diagnosed with multiple sclerosis, which is now believed to be the underlying cause of her syncope. She is currently under the care of a neurologist and is scheduled to start treatment on Monday  The patient has been experiencing nerve tingles in her hands and previously had numbness in her face, which has since resolved. She also reported difficulty with balance, describing it as walking like a "drunk squirrel," but this symptom has also improved.  The patient has been attending counseling sessions to manage test anxiety, which has been exacerbated by her recent diagnosis. She is also preparing to present her case to a school board for her counseling degree, as she had previously timed out of a test due to her anxiety.   Relevant histories: .  Social she works at [employer] and has does training in counseling ROS: As per HPI.   Studies Reviewed: .   Cardiac Studies & Procedures       ECHOCARDIOGRAM  ECHOCARDIOGRAM COMPLETE 12/17/2019  Narrative ECHOCARDIOGRAM REPORT    Patient Name:   Kelly Morton Date of Exam: 12/17/2019 Medical Rec #:  [id number]         Height:       68.0 in Accession #:    [id number]        Weight:       186.6  lb Date of Birth:  Jun 05, 1995          BSA:          1.984 m Patient Age:    24 years          BP:           117/75 mmHg Patient Gender: F                 HR:           86 bpm. Exam Location:  Inpatient  Procedure: 2D Echo and Strain Analysis  Indications:    Syncope 780.2 / R55  History:        Patient has no prior history of Echocardiogram examinations. Patient presenting with dozens of episodes of seizure-like activity in the past 4 days.  Sonographer:    Leta Jungling RDCS Referring Phys: [id number] Deno Lunger [facility]  IMPRESSIONS   1. Mild bileaflet mitral valve prolapse with mild mitral annular disjunction. Displacement distance 6 mm. The mitral valve is myxomatous. Mild mitral valve regurgitation. No evidence of mitral stenosis. 2. Left ventricular ejection fraction, by estimation, is 60 to 65%. The left ventricle has normal function. The left ventricle has no regional wall motion abnormalities. Left ventricular diastolic parameters were normal. The average left ventricular global longitudinal strain is -20.4 %. The global longitudinal strain is normal. 3.  Right ventricular systolic function is normal. The right ventricular size is normal. 4. The aortic valve is tricuspid. Aortic valve regurgitation is not visualized. No aortic stenosis is present.  FINDINGS Left Ventricle: Left ventricular ejection fraction, by estimation, is 60 to 65%. The left ventricle has normal function. The left ventricle has no regional wall motion abnormalities. The average left ventricular global longitudinal strain is -20.4 %. The global longitudinal strain is normal. The left ventricular internal cavity size was normal in size. There is no left ventricular hypertrophy. Left ventricular diastolic parameters were normal.  Right Ventricle: The right ventricular size is normal. No increase in right ventricular wall thickness. Right ventricular systolic function is normal.  Left Atrium: Left atrial size was  normal in size.  Right Atrium: Right atrial size was normal in size.  Pericardium: There is no evidence of pericardial effusion.  Mitral Valve: Mild bileaflet mitral valve prolapse with mild mitral annular disjunction. Displacement distance 6 mm. The mitral valve is myxomatous. Mild mitral valve regurgitation. No evidence of mitral valve stenosis.  Tricuspid Valve: The tricuspid valve is normal in structure. Tricuspid valve regurgitation is trivial. No evidence of tricuspid stenosis.  Aortic Valve: The aortic valve is tricuspid. Aortic valve regurgitation is not visualized. No aortic stenosis is present.  Pulmonic Valve: The pulmonic valve was normal in structure. Pulmonic valve regurgitation is trivial. No evidence of pulmonic stenosis.  Aorta: The aortic root is normal in size and structure.  Venous: The inferior vena cava was not well visualized.  IAS/Shunts: The interatrial septum was not well visualized.   LEFT VENTRICLE PLAX 2D LVIDd:         4.40 cm  Diastology LVIDs:         2.60 cm  LV e' lateral:   12.40 cm/s LV PW:         0.80 cm  LV E/e' lateral: 4.0 LV IVS:        0.80 cm  LV e' medial:    10.60 cm/s LVOT diam:     1.90 cm  LV E/e' medial:  4.7 LV SV:         39 LV SV Index:   20       2D Longitudinal Strain LVOT Area:     2.84 cm 2D Strain GLS Avg:     -20.4 %   RIGHT VENTRICLE RV S prime:     17.40 cm/s  LEFT ATRIUM             Index       RIGHT ATRIUM          Index LA diam:        3.30 cm 1.66 cm/m  RA Area:     8.88 cm LA Vol (A2C):   29.6 ml 14.92 ml/m RA Volume:   13.10 ml 6.60 ml/m LA Vol (A4C):   22.4 ml 11.29 ml/m LA Biplane Vol: 26.8 ml 13.51 ml/m AORTIC VALVE LVOT Vmax:   82.20 cm/s LVOT Vmean:  56.200 cm/s LVOT VTI:    0.138 m  AORTA Ao Asc diam: 2.70 cm  MITRAL VALVE MV Area (PHT): 3.03 cm    SHUNTS MV Decel Time: 250 msec    Systemic VTI:  0.14 m MV E velocity: 49.30 cm/s  Systemic Diam: 1.90 cm MV A velocity: 49.90 cm/s MV  E/A ratio:  0.99  Weston Brass MD Electronically signed by Weston Brass MD Signature Date/Time: 12/17/2019/12:07:34 PM    Final    MONITORS  CARDIAC EVENT MONITOR 10/11/2022  Narrative   Patient had a minimum heart rate of 49 bpm, maximum heart rate of 169 bpm, and average heart rate of 72 bpm.   Predominant underlying rhythm was sinus rhythm.   No atrial fibrillation or atrial flutter.   No evidence of significant heart block.   Triggered and diary events associated with sinus tachycardia or sinus rhythm.  No malignant arrhythmias.    CARDIAC MRI  MR CARDIAC MORPHOLOGY W WO CONTRAST 12/15/2021  Narrative CLINICAL DATA:  Clinical question of mitral annular disjunction Study assumes HCT of 42 and BSA of 2.16 m2.  EXAM: CARDIAC MRI  TECHNIQUE: The patient was scanned on a 1.5 Tesla GE magnet. A dedicated cardiac coil was used. Functional imaging was done using Fiesta sequences. 2,3, and 4 chamber views were done to assess for RWMA's. Modified Simpson's rule using a short axis stack was used to calculate an ejection fraction on a dedicated work [employer]. The patient received 12 cc of Gadavist. After 10 minutes inversion recovery sequences were used to assess for infiltration and scar tissue. Velocity encoding used for valve assessment.  CONTRAST:  12 cc  of Gadavist  FINDINGS: 1. Normal left ventricular size, with LVEDD 53 mm, and LVEDVi 62 mL/m2.  Normal left ventricular thickness, with intraventricular septal thickness of 8 mm, posterior wall thickness of 7 mm, and myocardial mass index of 40 g/m2.  Normal left ventricular systolic function (LVEF =59%). There are no regional wall motion abnormalities.  Left ventricular parametric mapping notable for normal T2 signal and normal ECV.  There is no late gadolinium enhancement in the left ventricular myocardium.  2. Normal right ventricular size with RVEDVI 65 mL/m2.  Normal right  ventricular thickness.  Normal right ventricular systolic function (RVEF =54%). There are no regional wall motion abnormalities or aneurysms.  3.  Normal left and right atrial size.  4. Normal size of the aortic root, ascending aorta and pulmonary artery.  5. Valve assessment:  Aortic Valve: Tri-leaflet aortic valve. There is no significant regurgitation. Regurgitation fraction 1.5 %  Pulmonic Valve: There is no significant regurgitation. Regurgitation fraction < 1 %  Tricuspid Valve: There is no significant regurgitation.  Mitral Valve: There is mild significant regurgitation. Regurgitation fraction 11 %. There is evidence of mitral annular disjunction of the posterior mitral valve leaflet. Bifid anterior and posterior papillary muscles. There is anterior mitral valve prolapse. There is posterior mitral valve billowing. There is anterolateral chordal laxity. There is no papillary muscle late gadolinium enhancement.  6.  Normal pericardium.  No pericardial effusion.  7. Grossly, no extracardiac findings. Recommended dedicated study if concerned for non-cardiac pathology.  IMPRESSION: There is evidence of mitral annular disjunction without high risk features  Riley Lam MD   Electronically Signed By: Riley Lam M.D. On: 12/17/2021 14:25          Physical Exam:    VS:  BP 112/70   Pulse (!) 113   Ht 5\' 8"  (1.727 m)   Wt 190 lb (86.2 kg)   LMP 02/07/2023 (Exact Date)   SpO2 97%   BMI 28.89 kg/m    Wt Readings from Last 3 Encounters:  03/01/23 190 lb (86.2 kg)  02/15/23 189 lb (85.7 kg)  01/31/23 196 lb 6.4 oz (89.1 kg)    Gen: no distress   Neck: No JVD,  Cardiac: No Rubs or Gallops, no Murmur, regular rhythm (tachycardia from EKG has resolved) +2 radial pulses Respiratory: Clear  to auscultation bilaterally, normal effort, normal  respiratory rate GI: Soft, nontender, non-distended  MS: No  edema;  moves all extremities Integument:  Skin feels warm Neuro:  At time of evaluation, alert and oriented to person/place/time/situation  Psych: Normal affect, patient feels well   ASSESSMENT AND PLAN: .    Multiple Sclerosis - Recently diagnosed with multiple sclerosis, likely explaining previous syncope and dizziness. Reports paresthesia in hands without pain or facial numbness.  - Treatment to start on Monday as per Dr. Terrace Arabia.  - Continue counseling for anxiety and emotional support - we will defer FMLA evaluations to either PCP or Neurology  Syncope - Monitor for new cardiac symptoms - Maintain referral to cardiac specialist as a precaution (it takes 18 months to see the dysautonomia clinic  Mitral annular disjunction - with no cardiac symptoms and mild mitral regurgitation - no LGE - PRN follow up, consider repeat echo in 2025  Follow up PRN for new cardiac symptoms (if new cardiac symptoms repeat echo then plan for follow up_   Riley Lam, MD FASE [facility] Cardiologist [facility]  [address], [address] [place], [place] [address] [phone]  8:26 AM

## 2023-03-01 NOTE — Patient Instructions (Signed)
Medication Instructions:  Your physician recommends that you continue on your current medications as directed. Please refer to the Current Medication list given to you today.  *If you need a refill on your cardiac medications before your next appointment, please call your pharmacy*   Lab Work: NONE  If you have labs (blood work) drawn today and your tests are completely normal, you will receive your results only by: MyChart Message (if you have MyChart) OR A paper copy in the mail If you have any lab test that is abnormal or we need to change your treatment, we will call you to review the results.   Testing/Procedures: NONE   Follow-Up:As needed At [facility], you and your health needs are our priority.  As part of our continuing mission to provide you with exceptional heart care, we have created designated Provider Care Teams.  These Care Teams include your primary Cardiologist (physician) and Advanced Practice Providers (APPs -  Physician Assistants and Nurse Practitioners) who all work together to provide you with the care you need, when you need it.   Provider:   Riley Lam, MD

## 2023-03-04 ENCOUNTER — Other Ambulatory Visit (HOSPITAL_COMMUNITY): Payer: Self-pay

## 2023-03-04 DIAGNOSIS — G35 Multiple sclerosis: Secondary | ICD-10-CM | POA: Diagnosis not present

## 2023-03-05 DIAGNOSIS — F908 Attention-deficit hyperactivity disorder, other type: Secondary | ICD-10-CM | POA: Diagnosis not present

## 2023-03-14 NOTE — Telephone Encounter (Signed)
Patient's first Ocrevus infusion was 03/04/2023.

## 2023-03-18 DIAGNOSIS — G35 Multiple sclerosis: Secondary | ICD-10-CM | POA: Diagnosis not present

## 2023-03-19 ENCOUNTER — Ambulatory Visit: Payer: 59 | Admitting: Neurology

## 2023-03-19 DIAGNOSIS — F908 Attention-deficit hyperactivity disorder, other type: Secondary | ICD-10-CM | POA: Diagnosis not present

## 2023-03-26 DIAGNOSIS — F908 Attention-deficit hyperactivity disorder, other type: Secondary | ICD-10-CM | POA: Diagnosis not present

## 2023-04-02 ENCOUNTER — Other Ambulatory Visit (HOSPITAL_COMMUNITY): Payer: Self-pay

## 2023-04-02 ENCOUNTER — Other Ambulatory Visit: Payer: Self-pay

## 2023-04-02 DIAGNOSIS — F908 Attention-deficit hyperactivity disorder, other type: Secondary | ICD-10-CM | POA: Diagnosis not present

## 2023-04-03 ENCOUNTER — Other Ambulatory Visit (HOSPITAL_COMMUNITY): Payer: Self-pay

## 2023-04-04 ENCOUNTER — Other Ambulatory Visit: Payer: Self-pay

## 2023-04-04 ENCOUNTER — Other Ambulatory Visit (HOSPITAL_COMMUNITY): Payer: Self-pay

## 2023-04-04 ENCOUNTER — Telehealth: Payer: Self-pay

## 2023-04-04 ENCOUNTER — Encounter (INDEPENDENT_AMBULATORY_CARE_PROVIDER_SITE_OTHER): Payer: 59 | Admitting: Neurology

## 2023-04-04 DIAGNOSIS — G35 Multiple sclerosis: Secondary | ICD-10-CM

## 2023-04-04 DIAGNOSIS — G43709 Chronic migraine without aura, not intractable, without status migrainosus: Secondary | ICD-10-CM

## 2023-04-04 DIAGNOSIS — R52 Pain, unspecified: Secondary | ICD-10-CM | POA: Insufficient documentation

## 2023-04-04 DIAGNOSIS — R269 Unspecified abnormalities of gait and mobility: Secondary | ICD-10-CM | POA: Insufficient documentation

## 2023-04-04 MED ORDER — DULOXETINE HCL 30 MG PO CPEP
30.0000 mg | ORAL_CAPSULE | Freq: Every day | ORAL | 5 refills | Status: DC
  Filled 2023-04-04 – 2023-04-19 (×2): qty 30, 30d supply, fill #0
  Filled 2023-05-18: qty 30, 30d supply, fill #1
  Filled 2023-06-16: qty 30, 30d supply, fill #2
  Filled 2023-07-18: qty 30, 30d supply, fill #3
  Filled 2023-08-19: qty 30, 30d supply, fill #4
  Filled 2023-09-19: qty 30, 30d supply, fill #5

## 2023-04-04 NOTE — Telephone Encounter (Signed)
Kelly Morton called to speak with Kelly Morton. Left a message cannot add MS profile. Would like a call back.

## 2023-04-04 NOTE — Telephone Encounter (Signed)
Returned call from [employer] at [employer] [employer], she spoke to [employer] at [facility] and previos specimen can't be used for MS panel due to not being frozen, only refrigerated. Dr, Terrace Arabia made aware.

## 2023-04-04 NOTE — Addendum Note (Signed)
Addended by: Lenn Cal on: 04/04/2023 11:40 AM   Modules accepted: Orders

## 2023-04-04 NOTE — Telephone Encounter (Signed)
I called patient, she complains of body achy pain, finished her loading dose of Ocrelizumab, tolerating it well, her headache has much improved, stop the nortriptyline, she is now taking modafinil as needed for fatigue, gabapentin up to 900 mg 3 times a day, Lexapro 20 mg every morning for her anxiety  1.  Add on low-dose Cymbalta 30 mg, if her body achy pain is helped by Cymbalta, may titrating up Cymbalta, gradually weaning off Lexapro,  2.  Suggested moderate exercise, warm compression,  Please see the MyChart message reply(ies) for my assessment and plan.    This patient gave consent for this Medical Advice Message and is aware that it may result in a bill to [employer], as well as the possibility of receiving a bill for a co-payment or deductible. They are an established patient, but are not seeking medical advice exclusively about a problem treated during an in person or video visit in the last seven days. I did not recommend an in person or video visit within seven days of my reply.    I spent a total of 7 minutes cumulative time within 7 days through [employer].  Levert Feinstein, MD

## 2023-04-04 NOTE — Addendum Note (Signed)
Addended by: Lenn Cal on: 04/04/2023 11:43 AM   Modules accepted: Orders

## 2023-04-08 DIAGNOSIS — F908 Attention-deficit hyperactivity disorder, other type: Secondary | ICD-10-CM | POA: Diagnosis not present

## 2023-04-15 DIAGNOSIS — F908 Attention-deficit hyperactivity disorder, other type: Secondary | ICD-10-CM | POA: Diagnosis not present

## 2023-04-16 ENCOUNTER — Other Ambulatory Visit (HOSPITAL_COMMUNITY): Payer: Self-pay

## 2023-04-19 ENCOUNTER — Other Ambulatory Visit (HOSPITAL_COMMUNITY): Payer: Self-pay

## 2023-04-22 ENCOUNTER — Other Ambulatory Visit (HOSPITAL_COMMUNITY): Payer: Self-pay

## 2023-04-22 DIAGNOSIS — F908 Attention-deficit hyperactivity disorder, other type: Secondary | ICD-10-CM | POA: Diagnosis not present

## 2023-04-27 ENCOUNTER — Other Ambulatory Visit: Payer: Self-pay | Admitting: Neurology

## 2023-04-30 ENCOUNTER — Other Ambulatory Visit (HOSPITAL_COMMUNITY): Payer: Self-pay

## 2023-04-30 MED ORDER — VITAMIN D (ERGOCALCIFEROL) 50000 UNITS PO CAPS
50000.0000 [IU] | ORAL_CAPSULE | ORAL | 0 refills | Status: DC
  Filled 2023-04-30: qty 7, 49d supply, fill #0

## 2023-05-02 ENCOUNTER — Other Ambulatory Visit (HOSPITAL_COMMUNITY): Payer: Self-pay

## 2023-05-03 ENCOUNTER — Other Ambulatory Visit: Payer: Self-pay

## 2023-05-08 DIAGNOSIS — F908 Attention-deficit hyperactivity disorder, other type: Secondary | ICD-10-CM | POA: Diagnosis not present

## 2023-05-15 DIAGNOSIS — F908 Attention-deficit hyperactivity disorder, other type: Secondary | ICD-10-CM | POA: Diagnosis not present

## 2023-05-19 ENCOUNTER — Other Ambulatory Visit (HOSPITAL_COMMUNITY): Payer: Self-pay

## 2023-05-21 ENCOUNTER — Other Ambulatory Visit (HOSPITAL_COMMUNITY): Payer: Self-pay

## 2023-05-21 DIAGNOSIS — Z01419 Encounter for gynecological examination (general) (routine) without abnormal findings: Secondary | ICD-10-CM | POA: Diagnosis not present

## 2023-05-21 DIAGNOSIS — Z130 Encounter for screening for diseases of the blood and blood-forming organs and certain disorders involving the immune mechanism: Secondary | ICD-10-CM | POA: Diagnosis not present

## 2023-05-21 DIAGNOSIS — Z1389 Encounter for screening for other disorder: Secondary | ICD-10-CM | POA: Diagnosis not present

## 2023-05-21 DIAGNOSIS — E282 Polycystic ovarian syndrome: Secondary | ICD-10-CM | POA: Diagnosis not present

## 2023-05-21 DIAGNOSIS — Z124 Encounter for screening for malignant neoplasm of cervix: Secondary | ICD-10-CM | POA: Diagnosis not present

## 2023-05-21 MED ORDER — METFORMIN HCL 500 MG PO TABS: 500.0000 mg | ORAL_TABLET | Freq: Two times a day (BID) | ORAL | 3 refills | Status: DC

## 2023-05-22 DIAGNOSIS — F908 Attention-deficit hyperactivity disorder, other type: Secondary | ICD-10-CM | POA: Diagnosis not present

## 2023-05-23 ENCOUNTER — Other Ambulatory Visit (HOSPITAL_COMMUNITY): Payer: Self-pay

## 2023-05-30 ENCOUNTER — Other Ambulatory Visit (HOSPITAL_COMMUNITY): Payer: Self-pay

## 2023-05-30 ENCOUNTER — Other Ambulatory Visit: Payer: Self-pay

## 2023-06-05 DIAGNOSIS — F908 Attention-deficit hyperactivity disorder, other type: Secondary | ICD-10-CM | POA: Diagnosis not present

## 2023-06-11 ENCOUNTER — Other Ambulatory Visit: Payer: Self-pay | Admitting: Neurology

## 2023-06-16 ENCOUNTER — Other Ambulatory Visit (HOSPITAL_COMMUNITY): Payer: Self-pay

## 2023-06-16 ENCOUNTER — Other Ambulatory Visit: Payer: Self-pay | Admitting: Neurology

## 2023-06-17 ENCOUNTER — Other Ambulatory Visit: Payer: Self-pay

## 2023-06-17 DIAGNOSIS — F908 Attention-deficit hyperactivity disorder, other type: Secondary | ICD-10-CM | POA: Diagnosis not present

## 2023-06-18 ENCOUNTER — Other Ambulatory Visit: Payer: Self-pay | Admitting: Neurology

## 2023-06-20 ENCOUNTER — Other Ambulatory Visit (HOSPITAL_BASED_OUTPATIENT_CLINIC_OR_DEPARTMENT_OTHER): Payer: Self-pay

## 2023-06-20 ENCOUNTER — Other Ambulatory Visit (HOSPITAL_COMMUNITY): Payer: Self-pay

## 2023-06-20 ENCOUNTER — Other Ambulatory Visit: Payer: Self-pay | Admitting: Neurology

## 2023-06-20 ENCOUNTER — Other Ambulatory Visit: Payer: Self-pay

## 2023-06-20 MED ORDER — VITAMIN D (ERGOCALCIFEROL) 50000 UNITS PO CAPS
50000.0000 [IU] | ORAL_CAPSULE | ORAL | 0 refills | Status: DC
  Filled 2023-06-20: qty 7, 49d supply, fill #0

## 2023-06-21 ENCOUNTER — Other Ambulatory Visit (HOSPITAL_COMMUNITY): Payer: Self-pay

## 2023-06-24 DIAGNOSIS — F908 Attention-deficit hyperactivity disorder, other type: Secondary | ICD-10-CM | POA: Diagnosis not present

## 2023-06-27 ENCOUNTER — Other Ambulatory Visit (HOSPITAL_COMMUNITY): Payer: Self-pay

## 2023-06-27 ENCOUNTER — Other Ambulatory Visit: Payer: Self-pay

## 2023-06-28 ENCOUNTER — Ambulatory Visit: Payer: Commercial Managed Care - PPO | Admitting: Student

## 2023-06-28 ENCOUNTER — Other Ambulatory Visit: Payer: Self-pay

## 2023-06-28 VITALS — BP 129/91 | HR 92 | Ht 68.0 in | Wt 170.6 lb

## 2023-06-28 DIAGNOSIS — R197 Diarrhea, unspecified: Secondary | ICD-10-CM

## 2023-06-28 DIAGNOSIS — R634 Abnormal weight loss: Secondary | ICD-10-CM | POA: Diagnosis not present

## 2023-06-28 DIAGNOSIS — Z23 Encounter for immunization: Secondary | ICD-10-CM

## 2023-06-28 LAB — POCT SEDIMENTATION RATE: POCT SED RATE: 1 mm/h (ref 0–22)

## 2023-06-28 NOTE — Progress Notes (Signed)
    SUBJECTIVE:   CHIEF COMPLAINT / HPI:   Weight Loss  Diarrhea  Kelly Morton is here today to discuss daily diarrhea over the past 4 months or so.  She tells me that she has having watery stools up to 15 times daily.  These are not painful, fatty, or bloody.  She does have a history of known internal hemorrhoids and had transient trace blood with wiping about 2 months ago that she attributed to her hemorrhoids.  Over the same time.  She has lost 57 pounds.  While this weight loss has been unintentional, she has been pleased with it and hence has not sought care before today. She did try taking loperamide x1 for the diarrhea but this "stopped her up" for two days which she did not like.  Symptoms started shortly after she got her infusion of Ocrevus on November 25.  She is slated to get these infusions every 6 months for her MS.  She is not presently on any daily oral MS medications.  She does take modafinil twice daily for fatigue.   PERTINENT  PMH / PSH: Multiple sclerosis   OBJECTIVE:   BP (!) 129/91   Pulse 92   Ht 5\' 8"  (1.727 m)   Wt 170 lb 9.6 oz (77.4 kg)   LMP 06/25/2023   SpO2 100%   BMI 25.94 kg/m   Gen: In good spirits, NAD  HENT: 3-4 beat fatigable horizontal nystagmus, EOMs intact and pupils equal round and reactive otherwise Pulm: Normal work of breathing on room air, speaking in full sentences  Abd: Flat, soft, non-distended  Neuro: Steady gait, strength and sensation intact. Benign neuro exam aside from fatigable nystagmus as noted above.  Psych: Mood and affect are appropriate to situation   ASSESSMENT/PLAN:   Assessment & Plan Weight loss 57 pounds of unintentional weight loss in 4 months in the setting of now chronic watery diarrhea.  The differential for this presentation is broad and complicated by her MS and relative immunosuppression.  She is no longer on steroids or medications like teriflunomide which is known to cause weight loss and diarrhea.  Differential  includes medication effect versus chronic infection in the setting of her immunosuppression versus comorbid autoimmune IBD versus malabsorption versus enteric nervous system involvement of her MS.  Thankfully, she is well-appearing and hemodynamically stable.  She is actually pleased with the weight loss that she has had, though if she were to lose much more weight we would run the risk of malnourishment. Modafinil can cause both diarrhea and weight loss, but I would not expect such a dramatic response.  While in less failure with the side effects of Ocrevus, my initial research suggests that these would be rare side effects and, again, I would not expect them to be as severe. - Lab workup ordered to include   - Serum studies: ESR, CRP, TSH, CBC, CMP, Celiac, B12, Vit D  - Stool studies: Fecal calprotectin, O&P, C diff  - Advised her to touch base with her neurologist to discuss these symptoms as potentially indicative of modafinil/Ocrevus side effects vs ENS involvement of MS which on cursory literature review seems to be quite rare    J Dorothyann Gibbs, MD [facility]

## 2023-06-28 NOTE — Patient Instructions (Signed)
 Olen Cordial to see you as always! Tell Luda to come see Dr. Carollee Herter.  I am checking a BUNCH of labs today.  I will call you if anything needs urgent intervention. You have the Loperamide on hand should you need it.   You will see Dr. Elliot Gurney in about 2 weeks for follow-up.   Call your neurologist if you develop other neuro symptoms because this *could* be enteric nervous system involvement of your MS and need alternative therapy.   Kelly Mccoy, MD

## 2023-07-01 ENCOUNTER — Encounter: Payer: Self-pay | Admitting: Student

## 2023-07-01 ENCOUNTER — Other Ambulatory Visit (HOSPITAL_COMMUNITY): Payer: Self-pay

## 2023-07-01 DIAGNOSIS — F908 Attention-deficit hyperactivity disorder, other type: Secondary | ICD-10-CM | POA: Diagnosis not present

## 2023-07-03 LAB — COMPREHENSIVE METABOLIC PANEL
ALT: 16 IU/L (ref 0–32)
AST: 12 IU/L (ref 0–40)
Albumin: 5.2 g/dL — ABNORMAL HIGH (ref 4.0–5.0)
Alkaline Phosphatase: 68 IU/L (ref 44–121)
BUN/Creatinine Ratio: 11 (ref 9–23)
BUN: 8 mg/dL (ref 6–20)
Bilirubin Total: 0.3 mg/dL (ref 0.0–1.2)
CO2: 23 mmol/L (ref 20–29)
Calcium: 9.7 mg/dL (ref 8.7–10.2)
Chloride: 102 mmol/L (ref 96–106)
Creatinine, Ser: 0.72 mg/dL (ref 0.57–1.00)
Globulin, Total: 2.4 g/dL (ref 1.5–4.5)
Glucose: 83 mg/dL (ref 70–99)
Potassium: 4.4 mmol/L (ref 3.5–5.2)
Sodium: 142 mmol/L (ref 134–144)
Total Protein: 7.6 g/dL (ref 6.0–8.5)
eGFR: 117 mL/min/{1.73_m2} (ref >59)

## 2023-07-03 LAB — TSH RFX ON ABNORMAL TO FREE T4: TSH: 1.4 u[IU]/mL (ref 0.450–4.500)

## 2023-07-03 LAB — CELIAC AB TTG DGP TIGA
Antigliadin Abs, IgA: 3 U (ref 0–19)
Gliadin IgG: 1 U (ref 0–19)
IgA/Immunoglobulin A, Serum: 101 mg/dL (ref 87–352)

## 2023-07-03 LAB — CBC WITH DIFFERENTIAL/PLATELET
Basophils Absolute: 0 10*3/uL (ref 0.0–0.2)
Basos: 0 %
EOS (ABSOLUTE): 0.1 10*3/uL (ref 0.0–0.4)
Eos: 1 %
Hematocrit: 46.2 % (ref 34.0–46.6)
Hemoglobin: 15.7 g/dL (ref 11.1–15.9)
Immature Grans (Abs): 0 10*3/uL (ref 0.0–0.1)
Immature Granulocytes: 0 %
Lymphocytes Absolute: 1.2 10*3/uL (ref 0.7–3.1)
Lymphs: 24 %
MCH: 32 pg (ref 26.6–33.0)
MCHC: 34 g/dL (ref 31.5–35.7)
MCV: 94 fL (ref 79–97)
Monocytes Absolute: 0.3 10*3/uL (ref 0.1–0.9)
Monocytes: 7 %
Neutrophils Absolute: 3.5 10*3/uL (ref 1.4–7.0)
Neutrophils: 68 %
Platelets: 319 10*3/uL (ref 150–450)
RBC: 4.9 x10E6/uL (ref 3.77–5.28)
RDW: 12.6 % (ref 11.7–15.4)
WBC: 5.1 10*3/uL (ref 3.4–10.8)

## 2023-07-03 LAB — C-REACTIVE PROTEIN: CRP: <1 mg/L (ref 0–10)

## 2023-07-03 LAB — FERRITIN: Ferritin: 33 ng/mL (ref 15–150)

## 2023-07-03 LAB — VITAMIN D 25 HYDROXY (VIT D DEFICIENCY, FRACTURES): Vit D, 25-Hydroxy: 53.9 ng/mL (ref 30.0–100.0)

## 2023-07-03 LAB — VITAMIN B12: Vitamin B-12: 1550 pg/mL — ABNORMAL HIGH (ref 232–1245)

## 2023-07-08 DIAGNOSIS — F908 Attention-deficit hyperactivity disorder, other type: Secondary | ICD-10-CM | POA: Diagnosis not present

## 2023-07-16 ENCOUNTER — Ambulatory Visit: Payer: Self-pay | Admitting: Student

## 2023-07-17 DIAGNOSIS — F908 Attention-deficit hyperactivity disorder, other type: Secondary | ICD-10-CM | POA: Diagnosis not present

## 2023-07-18 ENCOUNTER — Other Ambulatory Visit: Payer: Self-pay

## 2023-07-24 DIAGNOSIS — F908 Attention-deficit hyperactivity disorder, other type: Secondary | ICD-10-CM | POA: Diagnosis not present

## 2023-07-30 ENCOUNTER — Other Ambulatory Visit: Payer: Self-pay | Admitting: Neurology

## 2023-07-30 ENCOUNTER — Other Ambulatory Visit (HOSPITAL_COMMUNITY): Payer: Self-pay

## 2023-07-30 MED ORDER — MODAFINIL 100 MG PO TABS
100.0000 mg | ORAL_TABLET | Freq: Two times a day (BID) | ORAL | 5 refills | Status: DC
  Filled 2023-07-30: qty 60, 30d supply, fill #0
  Filled 2023-08-27: qty 60, 30d supply, fill #1
  Filled 2023-09-26 – 2023-09-30 (×2): qty 60, 30d supply, fill #2
  Filled 2023-10-28: qty 60, 30d supply, fill #3
  Filled 2023-11-27: qty 60, 30d supply, fill #4
  Filled 2023-12-27: qty 60, 30d supply, fill #5

## 2023-07-30 NOTE — Telephone Encounter (Signed)
 Requested Prescriptions   Pending Prescriptions Disp Refills   modafinil (PROVIGIL) 100 MG tablet 60 tablet 5    Sig: Take 1 tablet (100 mg total) by mouth 2 (two) times daily.   Vitamin D, Ergocalciferol, 50000 units CAPS 7 capsule 0    Sig: Take 1 capsule (50,000 Units) by mouth once a week.   Last seen 01/30/23, next appt 08/14/23  Dispenses   Dispensed Days Supply Quantity Provider Pharmacy  modafinil (PROVIGIL) 100 MG tablet 07/01/2023 30 60 tablet Levert Feinstein, MD [place] [facility]  modafinil (PROVIGIL) 100 MG tablet 05/31/2023 30 60 tablet Levert Feinstein, MD [place] [facility]  modafinil (PROVIGIL) 100 MG tablet 05/03/2023 30 60 tablet Levert Feinstein, MD [place] [facility]  modafinil (PROVIGIL) 100 MG tablet 04/03/2023 30 60 tablet Levert Feinstein, MD [place] [facility]  modafinil (PROVIGIL) 100 MG tablet 03/04/2023 30 60 tablet Levert Feinstein, MD [place] [facility]  modafinil (PROVIGIL) 100 MG tablet 02/01/2023 30 60 tablet Levert Feinstein, MD [place] [facility]Marland KitchenMarland Kitchen

## 2023-07-30 NOTE — Telephone Encounter (Signed)
 Meds ordered this encounter  Medications   modafinil (PROVIGIL) 100 MG tablet    Sig: Take 1 tablet (100 mg total) by mouth 2 (two) times daily.    Dispense:  60 tablet    Refill:  5

## 2023-07-31 DIAGNOSIS — F908 Attention-deficit hyperactivity disorder, other type: Secondary | ICD-10-CM | POA: Diagnosis not present

## 2023-08-01 ENCOUNTER — Other Ambulatory Visit (HOSPITAL_COMMUNITY): Payer: Self-pay

## 2023-08-01 ENCOUNTER — Other Ambulatory Visit: Payer: Self-pay | Admitting: Student

## 2023-08-01 DIAGNOSIS — R112 Nausea with vomiting, unspecified: Secondary | ICD-10-CM

## 2023-08-01 MED ORDER — ONDANSETRON 4 MG PO TBDP
4.0000 mg | ORAL_TABLET | Freq: Three times a day (TID) | ORAL | 0 refills | Status: DC | PRN
  Filled 2023-08-01: qty 30, 10d supply, fill #0

## 2023-08-06 ENCOUNTER — Other Ambulatory Visit: Payer: Self-pay | Admitting: Neurology

## 2023-08-06 ENCOUNTER — Other Ambulatory Visit (HOSPITAL_COMMUNITY): Payer: Self-pay

## 2023-08-06 MED ORDER — VITAMIN D (ERGOCALCIFEROL) 50000 UNITS PO CAPS
50000.0000 [IU] | ORAL_CAPSULE | ORAL | 0 refills | Status: DC
  Filled 2023-08-06: qty 7, 49d supply, fill #0

## 2023-08-09 ENCOUNTER — Other Ambulatory Visit (HOSPITAL_COMMUNITY): Payer: Self-pay

## 2023-08-14 ENCOUNTER — Ambulatory Visit: Payer: 59 | Admitting: Adult Health

## 2023-08-14 ENCOUNTER — Encounter: Payer: Self-pay | Admitting: Adult Health

## 2023-08-14 VITALS — BP 118/82 | HR 82 | Ht 68.0 in | Wt 166.0 lb

## 2023-08-14 DIAGNOSIS — G35 Multiple sclerosis: Secondary | ICD-10-CM | POA: Diagnosis not present

## 2023-08-14 DIAGNOSIS — G43709 Chronic migraine without aura, not intractable, without status migrainosus: Secondary | ICD-10-CM

## 2023-08-14 DIAGNOSIS — F908 Attention-deficit hyperactivity disorder, other type: Secondary | ICD-10-CM | POA: Diagnosis not present

## 2023-08-14 DIAGNOSIS — R5383 Other fatigue: Secondary | ICD-10-CM | POA: Diagnosis not present

## 2023-08-14 DIAGNOSIS — Z79899 Other long term (current) drug therapy: Secondary | ICD-10-CM | POA: Diagnosis not present

## 2023-08-14 DIAGNOSIS — R52 Pain, unspecified: Secondary | ICD-10-CM | POA: Diagnosis not present

## 2023-08-14 DIAGNOSIS — R269 Unspecified abnormalities of gait and mobility: Secondary | ICD-10-CM | POA: Diagnosis not present

## 2023-08-14 NOTE — Progress Notes (Signed)
 Chief Complaint  Patient presents with   Follow-up    Pt with friend, rm 3, 6 mth follow up. PCP has noticed some issues with her vision. She has pain flare ups in the cervical neck. A month ago woke up and was trying to go to bathroom, she tripped and then while she brushing teeth her legs became weaker and was unable to stand. This lasted 7/8 hrs and then was better. On Monday she was experiencing electric sensation on the left front of brain and happened through out the day which was new.      ASSESSMENT AND PLAN  Kelly Morton is a 28 y.o. female   Relapsing remitting multiple sclerosis, Gait abnormality  Continue Ocrevus infusion every 6 months - next infusion 08/2023, initial infusion 02/2023  Obtain IgG, IgA and IgM, recent CBC/D satisfactory  Continue gabapentin  900 mg 2-3 times daily for pain  Continue duloxetine  30 mg daily for pain/headaches  Continue modafinil  100 mg twice daily for fatigue, advised to take 2nd dose prior to 3pm  Discussed initiating muscle relaxant for cervicalgia but declines interest at this time  Discussed initiating medication to assist with bladder dysfunction but declines interest at this time Repeat MRI brain, C-spine and T-spine per patient request and for surveillance monitoring as more recent episodes of intermittent weakness, brain zaps, and bladder/bowel dysfunction Will prescribe Xanax  to take prior to MRI due to claustrophobia, is aware to have a driver with her Lumbar puncture benign  Chronic migraine headaches  Currently well-controlled Continue duloxetine  30 mg daily, Maxalt  as needed     Follow-up in 3 to 4 months with Dr. Gracie Morton or call earlier if needed      DIAGNOSTIC DATA (LABS, IMAGING, TESTING) - I reviewed patient records, labs, notes, testing and imaging myself where available.   MEDICAL HISTORY:  Update 08/14/2023 Kelly Morton: patient returns for follow up visit accompanied by a friend.  She was started on Ocrevus in  02/2023, next infusion scheduled 5/12.  Tolerated infusion well.  She does report intermittent episodes of lower extremity weakness, reports a couple months ago she had a few days of left foot drop but eventually subsided on its own and 1 month ago she experienced bilateral leg weakness which resolved after about 7-8 hours.  She does occasionally have gait impairment and will use a cane or rollator walker as needed.  She also reports head zaps sensations that started 2 days ago on the left side of her head, no other associated symptoms.  She does have occasional dizziness.  Reports her PCP noted nystagmus on exam when looking vertically, her vision can take a couple seconds to adjust when moving her eyes horizontally.  Denies loss of vision, blurry vision or diplopia.  She has been having more difficulty with urination since around November, at 1 point took over 6 minutes to urinate, now was able to urinate after about 1 minute.  She was also having persistent diarrhea and a couple episodes of incontinence but this randomly subsided about a month ago.  She has remained on modafinil  currently taking 100 mg in the morning and at bedtime which she reports has been helping with fatigue, denies issues with sleeping or insomnia with taking second dose prior to bedtime.  Reports improvement of migraines, currently occurring once a month or every other month, usually resolves with Excedrin  but will use rizatriptan  for more severe migraines.  Currently on duloxetine  30 mg daily which she feels greater benefit compared to  nortriptyline .  Remains on gabapentin  900 mg BID which helps mainly with neck pain, occasionally will take extra dose if needed.  She continues to work at [employer] and has not had any great difficulty with this.  She is wanting repeat imaging due to symptoms of weakness, brain zaps and impaired bladder/bowel functioning since prior imaging completed in 12/2022.     Update January 30, 2023 Dr.  Gracie Morton: Discussed the use of AI scribe software for clinical note transcription with the patient, who gave verbal consent to proceed.  Relapsing, remitting multiple sclerosis, presents for a follow-up visit. They report that their condition has been aggressive, with significant changes over the past three years, including spinal cord involvement. They experience a sensation described as 'awful' when looking down, experience  sign which often leads to migraines. They report daily migraines, which are somewhat managed by Maxalt , a medication that dissolves under the tongue.  The patient also reports heavy menstruation and is currently taking iron supplements daily due to low iron levels. They have been prescribed Vitamin D  supplements due to a deficiency and are advised to continue over-the-counter supplements once the prescription is finished.  The patient has not yet had a lumbar puncture, which is to be scheduled at [facility]. They also report fatigue, which has been the most challenging symptom for them. They live with their fiance and are not considering having children due to polycystic ovary syndrome (PCOS)    Personally reviewed MRI of thoracic spine T3-4, T9 involvement, with contrast-enhancement at T3-4 level  MRI of cervical spine cord signal abnormality at C2-3, C5, subtle contrast-enhancement at C2-3 lesion.  MRI of the brain, MS lesion involving left medullary, upper cervical left cerebellum periventricular, parietal, corpus callosum, enhancing lesions  Extensive laboratory evaluation showed normal or negative CMP, CBC, hepatitis panel, ANA, IgE, protein electrophoresis, vitamin D , folic acid, TSH, C-reactive protein, RPR, varicella-zoster antibody, QuantiFERON TB, JC virus was negative, vitamin D  level was 16, ferritin was 9, on supplement   Virtual Visit via video January 17 2023 Dr. Gracie Morton: I discussed the limitations of evaluation and management by telemedicine and the availability  of in person appointments. The patient expressed understanding and agreed to proceed  Location: Provider: [facility] office; Patient: Office with her friend  I connected with Kelly Morton  on January 17 2023 by a video enabled telemedicine application and verified that I am speaking with the correct person using two identifiers.  UPDATED HiSTORY She continues to have paresthesia involving lower extremity, left facial paresthesia  MRI showed significant abnormality, we reviewed the film together contrast-enhancement at C2-3 lesion virtual share MRI of the brain with without contrast showed multiple T2/FLAIR lesions involving left medulla, cerebellum, bilateral periventricular, periatrial region, corpus callosum, multiple enhancing lesions, MRI of cervical spine showed spinal cord hyperintensity at C2-3 on the left eventually at C5, distant with active demyelinating plaque MRI of the thoracic spine also showed T3-4, T9 with postcontrast enhancement, consistent with active demyelinating plaque    Consult visit 01/04/2023  Dr. Gracie Morton: Kelly Morton is a 28 year old female, seen in request by her primary care physician Dr. Jearldine Mina, [place] [facility] for evaluation of left arm facial paresthesia gait abnormality initial evaluation was on January 04, 2023  I reviewed and summarized the referring note. PMHX Anxiety Chronic migraine PCOS  On December 12, 2022, she began to feel left lateral arm paresthesia, tingling, 3 days later on December 15, 2022, she noticed needle prick sensation involving her left face, scalp, chin  area,  On December 27, 2022, she felt dizzy, then noticed gait abnormality, which is persistent since then, fell few times,  January 02, 2023, she began to noticed symmetric bilateral plantar feet numbness, denies bowel and bladder incontinence  When she bent down her neck, she felt radiating discomfort from mid back to sacral region  She had a history of left lateral thigh shingle in  June 2021, she showed me the picture, rash involving left L4 myotomes then.  She presented to the emergency room December 17, 2022, personally reviewed CT head without contrast was normal Labs showed low B12 221, began to receiving B12 injection, otherwise normal CBC CMP       PHYSICAL EXAMNIATION:     06/28/2023   10:31 AM 03/01/2023    8:00 AM 02/15/2023    5:15 PM  Vitals with BMI  Height 5\' 8"  5\' 8"    Weight 170 lbs 10 oz 190 lbs   BMI 25.95 28.9   Systolic 129 112 161  Diastolic 91 70 83  Pulse 92 113 69     Gen: NAD, very pleasant young Caucasian female, conversant, well nourised, well groomed                     Cardiovascular: Regular rate rhythm, no peripheral edema, warm, nontender. Eyes: Conjunctivae clear without exudates or hemorrhage Neck: Supple, no carotid bruits. Pulmonary: Clear to auscultation bilaterally   NEUROLOGICAL EXAM:  MENTAL STATUS: Speech/cognition: Awake, alert oriented to history taking and casual conversation  CRANIAL NERVES: CN II: Visual fields are full to confrontation.  Pupils are round equal and briskly reactive to light. CN III, IV, VI: extraocular movement are normal, does have 2-3 beat nystagmus looking horizontally but not sustained. No ptosis. CN V: Facial sensation is intact to pinprick in all 3 divisions bilaterally. Corneal responses are intact.  CN VII: Face is symmetric with normal eye closure and smile. CN VIII: Hearing is normal to casual conversation CN IX, X: Palate elevates symmetrically. Phonation is normal. CN XI: Head turning and shoulder shrug are intact CN XII: Tongue is midline with normal movements and no atrophy.  MOTOR: There is no pronator drift of out-stretched arms. Muscle bulk and tone are normal. Muscle strength is normal.  REFLEXES: Reflexes are 3 and symmetric at the biceps, triceps, knees, and ankles. Plantar responses are extensor bilaterally  SENSORY: Intact to light touch, pinprick, positional  and vibratory sensation are intact in fingers and toes.  COORDINATION: Rapid alternating movements and fine finger movements are intact. There is no dysmetria on finger-to-nose and heel-knee-shin.    GAIT/STANCE: Stands from seated position without difficulty.  Not currently using AD.    REVIEW OF SYSTEMS:  Full 14 system review of systems performed and notable only for as above All other review of systems were negative.   ALLERGIES: Allergies  Allergen Reactions   Fire Ant Anaphylaxis   Bactrim [Sulfamethoxazole-Trimethoprim] Hives   Elemental Sulfur Hives   Sulfur Hives    HOME MEDICATIONS: Current Outpatient Medications  Medication Sig Dispense Refill   acetaminophen  (TYLENOL ) 325 MG tablet Take 650 mg by mouth every 6 (six) hours as needed for mild pain.     ALPRAZolam  (XANAX ) 1 MG tablet Take 1-2 tablets 30 minutes prior to MRI, may repeat once as needed. Must have driver. 3 tablet 0   aspirin -acetaminophen -caffeine  (EXCEDRIN  MIGRAINE) 250-250-65 MG tablet Take 3 tablets by mouth every 6 (six) hours as needed for headache or migraine.  cyanocobalamin  (VITAMIN B12) 1000 MCG tablet Take 1 tablet (1,000 mcg total) by mouth daily. 90 tablet 3   DULoxetine  (CYMBALTA ) 30 MG capsule Take 1 capsule (30 mg total) by mouth daily. 30 capsule 5   escitalopram  (LEXAPRO ) 20 MG tablet Take 1 tablet (20 mg total) by mouth daily. 90 tablet 3   fluticasone  (FLONASE ) 50 MCG/ACT nasal spray Place 2 sprays into both nostrils daily. 16 g 3   gabapentin  (NEURONTIN ) 300 MG capsule Take 3 capsules (900 mg total) by mouth 3 (three) times daily. 270 capsule 11   ibuprofen  (ADVIL ) 600 MG tablet Take 1 tablet (600 mg total) by mouth every 6 (six) hours as needed for moderate pain or cramping. 90 tablet 3   metFORMIN  (GLUCOPHAGE ) 500 MG tablet Take 1 tablet (500 mg total) by mouth 2 (two) times daily with a meal. 180 tablet 3   modafinil  (PROVIGIL ) 100 MG tablet Take 1 tablet (100 mg total) by mouth 2  (two) times daily. 60 tablet 5   ondansetron  (ZOFRAN -ODT) 4 MG disintegrating tablet Take 1 tablet (4 mg total) by mouth every 8 (eight) hours as needed for nausea or vomiting. 30 tablet 0   rizatriptan  (MAXALT -MLT) 10 MG disintegrating tablet Take 1 tablet (10 mg total) by mouth as needed. May repeat in 2 hours if needed 12 tablet 6   triamcinolone  ointment (KENALOG ) 0.1 % Apply topically 2 (two) times daily. 454 g 0   Vitamin D , Ergocalciferol , 50000 units CAPS Take 1 capsule (50,000 Units) by mouth once a week. 7 capsule 0   No current facility-administered medications for this visit.    PAST MEDICAL HISTORY: Past Medical History:  Diagnosis Date   H. pylori infection 08/28/2020   Injury of meniscus of left knee 12/16/2021   Ovarian cyst    PTSD (post-traumatic stress disorder)    Sore throat 05/18/2020   Syncope     PAST SURGICAL HISTORY: Past Surgical History:  Procedure Laterality Date   IR LUMBAR PUNCTURE  02/15/2023   OOPHORECTOMY     TONSILLECTOMY      FAMILY HISTORY: Family History  Problem Relation Age of Onset   Hypertension Mother    Stroke Mother    Hypertension Father    Cervical cancer Maternal Grandmother    Prostate cancer Maternal Grandfather    Lymphoma Maternal Grandfather    Diabetes Paternal Grandmother     SOCIAL HISTORY: Social History   Socioeconomic History   Marital status: Significant Other    Spouse name: emily (luda)   Number of children: 0   Years of education: Not on file   Highest education level: Bachelor's degree (e.g., BA, AB, BS)  Occupational History   Not on file  Tobacco Use   Smoking status: Never    Passive exposure: Never   Smokeless tobacco: Never  Vaping Use   Vaping status: Never Used  Substance and Sexual Activity   Alcohol use: Yes    Alcohol/week: 7.0 standard drinks of alcohol    Types: 6 Glasses of wine, 1 Cans of beer per week   Drug use: Never   Sexual activity: Yes    Comment: same sex relationship   Other Topics Concern   Not on file  Social History Narrative   Not on file   Social Drivers of Health   Financial Resource Strain: Low Risk  (06/28/2023)   Overall Financial Resource Strain (CARDIA)    Difficulty of Paying Living Expenses: Not very hard  Food Insecurity: No Food  Insecurity (06/28/2023)   Hunger Vital Sign    Worried About Running Out of Food in the Last Year: Never true    Ran Out of Food in the Last Year: Never true  Transportation Needs: No Transportation Needs (06/28/2023)   PRAPARE - Administrator, Civil Service (Medical): No    Lack of Transportation (Non-Medical): No  Physical Activity: Inactive (06/28/2023)   Exercise Vital Sign    Days of Exercise per Week: 0 days    Minutes of Exercise per Session: 30 min  Stress: Stress Concern Present (06/28/2023)   [employer] of Occupational Health - Occupational Stress Questionnaire    Feeling of Stress : Very much  Social Connections: Moderately Isolated (06/28/2023)   Social Connection and Isolation Panel [NHANES]    Frequency of Communication with Friends and Family: Twice a week    Frequency of Social Gatherings with Friends and Family: Once a week    Attends Religious Services: Never    Database administrator or Organizations: No    Attends Engineer, structural: Not on file    Marital Status: Living with partner  Intimate Partner Violence: Not on file      I spent over 45 minutes of face-to-face and non-face-to-face time with patient.  This included previsit chart review, lab review, study review, order entry, electronic health record documentation, patient education and discussion regarding above diagnoses and treatment plan and answered all other questions to patient's satisfaction  Johny Nap, [facility]  [facility] [address] [place], [place] [address]  Phone [phone] Fax [phone] Note: This document was prepared with digital dictation  and possible smart phrase technology. Any transcriptional errors that result from this process are unintentional.

## 2023-08-15 ENCOUNTER — Other Ambulatory Visit (HOSPITAL_COMMUNITY): Payer: Self-pay

## 2023-08-15 ENCOUNTER — Encounter: Payer: Self-pay | Admitting: Adult Health

## 2023-08-15 LAB — IGG, IGA, IGM
IgA/Immunoglobulin A, Serum: 94 mg/dL (ref 87–352)
IgG (Immunoglobin G), Serum: 846 mg/dL (ref 586–1602)
IgM (Immunoglobulin M), Srm: 39 mg/dL (ref 26–217)

## 2023-08-15 MED ORDER — ALPRAZOLAM 1 MG PO TABS
ORAL_TABLET | ORAL | 0 refills | Status: AC
  Filled 2023-08-15: qty 1, 1d supply, fill #0

## 2023-08-15 NOTE — Addendum Note (Signed)
 Addended by: Johny Nap L on: 08/15/2023 01:56 PM   Modules accepted: Orders

## 2023-08-21 ENCOUNTER — Ambulatory Visit (INDEPENDENT_AMBULATORY_CARE_PROVIDER_SITE_OTHER)

## 2023-08-21 DIAGNOSIS — G35 Multiple sclerosis: Secondary | ICD-10-CM

## 2023-08-21 DIAGNOSIS — Z79899 Other long term (current) drug therapy: Secondary | ICD-10-CM | POA: Diagnosis not present

## 2023-08-21 DIAGNOSIS — F908 Attention-deficit hyperactivity disorder, other type: Secondary | ICD-10-CM | POA: Diagnosis not present

## 2023-08-21 MED ORDER — GADOBENATE DIMEGLUMINE 529 MG/ML IV SOLN
15.0000 mL | Freq: Once | INTRAVENOUS | Status: AC | PRN
  Administered 2023-08-21: 15 mL via INTRAVENOUS

## 2023-08-22 ENCOUNTER — Encounter: Payer: Self-pay | Admitting: Adult Health

## 2023-08-27 ENCOUNTER — Other Ambulatory Visit: Payer: Self-pay

## 2023-08-27 ENCOUNTER — Other Ambulatory Visit (HOSPITAL_COMMUNITY): Payer: Self-pay

## 2023-08-28 DIAGNOSIS — F908 Attention-deficit hyperactivity disorder, other type: Secondary | ICD-10-CM | POA: Diagnosis not present

## 2023-09-02 DIAGNOSIS — G35 Multiple sclerosis: Secondary | ICD-10-CM | POA: Diagnosis not present

## 2023-09-04 DIAGNOSIS — F908 Attention-deficit hyperactivity disorder, other type: Secondary | ICD-10-CM | POA: Diagnosis not present

## 2023-09-09 DIAGNOSIS — M9901 Segmental and somatic dysfunction of cervical region: Secondary | ICD-10-CM | POA: Diagnosis not present

## 2023-09-09 DIAGNOSIS — M62838 Other muscle spasm: Secondary | ICD-10-CM | POA: Diagnosis not present

## 2023-09-09 DIAGNOSIS — M9902 Segmental and somatic dysfunction of thoracic region: Secondary | ICD-10-CM | POA: Diagnosis not present

## 2023-09-11 DIAGNOSIS — M9902 Segmental and somatic dysfunction of thoracic region: Secondary | ICD-10-CM | POA: Diagnosis not present

## 2023-09-11 DIAGNOSIS — M9901 Segmental and somatic dysfunction of cervical region: Secondary | ICD-10-CM | POA: Diagnosis not present

## 2023-09-11 DIAGNOSIS — M62838 Other muscle spasm: Secondary | ICD-10-CM | POA: Diagnosis not present

## 2023-09-11 DIAGNOSIS — F908 Attention-deficit hyperactivity disorder, other type: Secondary | ICD-10-CM | POA: Diagnosis not present

## 2023-09-17 ENCOUNTER — Other Ambulatory Visit: Payer: Self-pay

## 2023-09-17 ENCOUNTER — Emergency Department (HOSPITAL_COMMUNITY)

## 2023-09-17 ENCOUNTER — Emergency Department (HOSPITAL_COMMUNITY)
Admission: EM | Admit: 2023-09-17 | Discharge: 2023-09-17 | Disposition: A | Discharge status: Home / Self Care | Attending: Emergency Medicine | Admitting: Emergency Medicine

## 2023-09-17 ENCOUNTER — Encounter (HOSPITAL_COMMUNITY): Payer: Self-pay

## 2023-09-17 DIAGNOSIS — R102 Pelvic and perineal pain: Secondary | ICD-10-CM | POA: Insufficient documentation

## 2023-09-17 DIAGNOSIS — R109 Unspecified abdominal pain: Secondary | ICD-10-CM | POA: Diagnosis present

## 2023-09-17 DIAGNOSIS — R1032 Left lower quadrant pain: Secondary | ICD-10-CM | POA: Diagnosis not present

## 2023-09-17 DIAGNOSIS — K760 Fatty (change of) liver, not elsewhere classified: Secondary | ICD-10-CM | POA: Diagnosis not present

## 2023-09-17 DIAGNOSIS — N83292 Other ovarian cyst, left side: Secondary | ICD-10-CM | POA: Diagnosis not present

## 2023-09-17 DIAGNOSIS — Z90721 Acquired absence of ovaries, unilateral: Secondary | ICD-10-CM | POA: Diagnosis not present

## 2023-09-17 DIAGNOSIS — N854 Malposition of uterus: Secondary | ICD-10-CM | POA: Diagnosis not present

## 2023-09-17 LAB — URINALYSIS, ROUTINE W REFLEX MICROSCOPIC
Bilirubin Urine: NEGATIVE
Glucose, UA: NEGATIVE mg/dL
Hgb urine dipstick: NEGATIVE
Ketones, ur: NEGATIVE mg/dL
Leukocytes,Ua: NEGATIVE
Nitrite: NEGATIVE
Protein, ur: NEGATIVE mg/dL
Specific Gravity, Urine: 1.002 — ABNORMAL LOW (ref 1.005–1.030)
pH: 7 (ref 5.0–8.0)

## 2023-09-17 LAB — CBC WITH DIFFERENTIAL/PLATELET
Abs Immature Granulocytes: 0.01 10*3/uL (ref 0.00–0.07)
Basophils Absolute: 0 10*3/uL (ref 0.0–0.1)
Basophils Relative: 0 %
Eosinophils Absolute: 0.2 10*3/uL (ref 0.0–0.5)
Eosinophils Relative: 2 %
HCT: 40.6 % (ref 36.0–46.0)
Hemoglobin: 13.7 g/dL (ref 12.0–15.0)
Immature Granulocytes: 0 %
Lymphocytes Relative: 19 %
Lymphs Abs: 1.4 10*3/uL (ref 0.7–4.0)
MCH: 32.4 pg (ref 26.0–34.0)
MCHC: 33.7 g/dL (ref 30.0–36.0)
MCV: 96 fL (ref 80.0–100.0)
Monocytes Absolute: 0.4 10*3/uL (ref 0.1–1.0)
Monocytes Relative: 6 %
Neutro Abs: 5.3 10*3/uL (ref 1.7–7.7)
Neutrophils Relative %: 73 %
Platelets: 268 10*3/uL (ref 150–400)
RBC: 4.23 MIL/uL (ref 3.87–5.11)
RDW: 12.7 % (ref 11.5–15.5)
WBC: 7.3 10*3/uL (ref 4.0–10.5)
nRBC: 0 % (ref 0.0–0.2)

## 2023-09-17 LAB — COMPREHENSIVE METABOLIC PANEL WITH GFR
ALT: 23 U/L (ref 0–44)
AST: 16 U/L (ref 15–41)
Albumin: 4.2 g/dL (ref 3.5–5.0)
Alkaline Phosphatase: 53 U/L (ref 38–126)
Anion gap: 9 (ref 5–15)
BUN: 13 mg/dL (ref 6–20)
CO2: 23 mmol/L (ref 22–32)
Calcium: 8.9 mg/dL (ref 8.9–10.3)
Chloride: 106 mmol/L (ref 98–111)
Creatinine, Ser: 0.5 mg/dL (ref 0.44–1.00)
GFR, Estimated: >60 mL/min (ref >60)
Glucose, Bld: 88 mg/dL (ref 70–99)
Potassium: 3.7 mmol/L (ref 3.5–5.1)
Sodium: 138 mmol/L (ref 135–145)
Total Bilirubin: 0.3 mg/dL (ref 0.0–1.2)
Total Protein: 6.9 g/dL (ref 6.5–8.1)

## 2023-09-17 LAB — LIPASE, BLOOD: Lipase: 30 U/L (ref 11–51)

## 2023-09-17 LAB — HCG, SERUM, QUALITATIVE: Preg, Serum: NEGATIVE

## 2023-09-17 MED ORDER — MORPHINE SULFATE (PF) 4 MG/ML IV SOLN
4.0000 mg | Freq: Once | INTRAVENOUS | Status: AC
  Administered 2023-09-17: 4 mg via INTRAVENOUS
  Filled 2023-09-17: qty 1

## 2023-09-17 MED ORDER — ONDANSETRON HCL 4 MG/2ML IJ SOLN
4.0000 mg | Freq: Once | INTRAMUSCULAR | Status: AC
Start: 2023-09-17 — End: 2023-09-17
  Administered 2023-09-17: 4 mg via INTRAVENOUS
  Filled 2023-09-17: qty 2

## 2023-09-17 MED ORDER — DIPHENHYDRAMINE HCL 50 MG/ML IJ SOLN
25.0000 mg | Freq: Once | INTRAMUSCULAR | Status: AC
Start: 2023-09-17 — End: 2023-09-17
  Administered 2023-09-17: 25 mg via INTRAVENOUS
  Filled 2023-09-17: qty 1

## 2023-09-17 MED ORDER — FENTANYL CITRATE PF 50 MCG/ML IJ SOSY
50.0000 ug | PREFILLED_SYRINGE | Freq: Once | INTRAMUSCULAR | Status: AC
  Administered 2023-09-17: 50 ug via INTRAVENOUS
  Filled 2023-09-17: qty 1

## 2023-09-17 MED ORDER — IOHEXOL 300 MG/ML  SOLN
100.0000 mL | Freq: Once | INTRAMUSCULAR | Status: AC | PRN
  Administered 2023-09-17: 100 mL via INTRAVENOUS

## 2023-09-17 NOTE — ED Notes (Signed)
 Patient taken to ultrasound.

## 2023-09-17 NOTE — Discharge Instructions (Addendum)
 Today you were seen for abdominal pain.  You may alternate taking Tylenol  and Motrin  as needed for pain. Please return to the ED if you have vomiting and are unable to keep fluids down, fever that does not reduce with Tylenol  or Motrin , or worsening pain.  Thank you for letting us  treat you today. After reviewing your labs and imaging, I feel you are safe to go home. Please follow up with your PCP in the next several days and provide them with your records from this visit. Return to the Emergency Room if pain becomes severe or symptoms worsen.

## 2023-09-17 NOTE — ED Triage Notes (Signed)
 Pt c/o left abdominal pain that began suddenly while at work. Pt states she has hx of PCOS and ovarian cyst. Pt denies any n/v.

## 2023-09-17 NOTE — ED Provider Notes (Signed)
 [place] EMERGENCY DEPARTMENT AT [facility] Provider Note   CSN: [id number] Arrival date & time: 09/17/23  1208     History  Chief Complaint  Patient presents with   Abdominal Pain    Kelly Morton is a 28 y.o. female past medical history significant for MS, ovarian cysts and PCOS presents today for left-sided abdominal pain that began suddenly while at work.  Patient denies fever, chills, nausea, vomiting, shortness of breath, chest pain, diarrhea, constipation, any other complaints at this time.  Patient reports previous surgery to remove her right ovary and fallopian tube due to a dermoid cyst in 2014.   Abdominal Pain      Home Medications Prior to Admission medications   Medication Sig Start Date End Date Taking? Authorizing Provider  acetaminophen  (TYLENOL ) 325 MG tablet Take 650 mg by mouth every 6 (six) hours as needed for mild pain.    [provider]  ALPRAZolam  (XANAX ) 1 MG tablet Take 1 tablets 30 minutes prior to MRI 08/15/23   Johny Nap, NP  aspirin -acetaminophen -caffeine  (EXCEDRIN  MIGRAINE) 250-250-65 MG tablet Take 3 tablets by mouth every 6 (six) hours as needed for headache or migraine.     [provider]  cyanocobalamin  (VITAMIN B12) 1000 MCG tablet Take 1 tablet (1,000 mcg total) by mouth daily. 02/01/23   Limmie Ren, MD  DULoxetine  (CYMBALTA ) 30 MG capsule Take 1 capsule (30 mg total) by mouth daily. 04/04/23   Phebe Brasil, MD  escitalopram  (LEXAPRO ) 20 MG tablet Take 1 tablet (20 mg total) by mouth daily. 01/01/23   Limmie Ren, MD  fluticasone  (FLONASE ) 50 MCG/ACT nasal spray Place 2 sprays into both nostrils daily. 07/11/22   Limmie Ren, MD  gabapentin  (NEURONTIN ) 300 MG capsule Take 3 capsules (900 mg total) by mouth 3 (three) times daily. 01/30/23   Phebe Brasil, MD  ibuprofen  (ADVIL ) 600 MG tablet Take 1 tablet (600 mg total) by mouth every 6 (six) hours as needed for moderate pain or cramping. 02/01/23    Limmie Ren, MD  metFORMIN  (GLUCOPHAGE ) 500 MG tablet Take 1 tablet (500 mg total) by mouth 2 (two) times daily with a meal. 01/28/23   Limmie Ren, MD  modafinil  (PROVIGIL ) 100 MG tablet Take 1 tablet (100 mg total) by mouth 2 (two) times daily. 07/30/23   Phebe Brasil, MD  ondansetron  (ZOFRAN -ODT) 4 MG disintegrating tablet Take 1 tablet (4 mg total) by mouth every 8 (eight) hours as needed for nausea or vomiting. 08/01/23   Sowell, Brandon, MD  rizatriptan  (MAXALT -MLT) 10 MG disintegrating tablet Take 1 tablet (10 mg total) by mouth as needed. May repeat in 2 hours if needed 01/17/23   Phebe Brasil, MD  triamcinolone  ointment (KENALOG ) 0.1 % Apply topically 2 (two) times daily. 03/09/22   Limmie Ren, MD  Vitamin D , Ergocalciferol , 50000 units CAPS Take 1 capsule (50,000 Units) by mouth once a week. 08/06/23   Phebe Brasil, MD  nortriptyline  (PAMELOR ) 10 MG capsule Take 2 capsules (20 mg total) by mouth at bedtime. 01/30/23 02/01/23  Phebe Brasil, MD      Allergies    Fire ant, Bactrim [sulfamethoxazole-trimethoprim], Elemental sulfur, and Sulfur    Review of Systems   Review of Systems  Gastrointestinal:  Positive for abdominal pain.    Physical Exam Updated Vital Signs BP 122/84   Pulse 76   Temp 98.5 F (36.9 C) (Oral)   Resp 17   SpO2 97%  Physical Exam Vitals  and nursing note reviewed.  Constitutional:      General: She is not in acute distress.    Appearance: She is well-developed. She is not ill-appearing.  HENT:     Head: Normocephalic and atraumatic.     Mouth/Throat:     Mouth: Mucous membranes are moist.     Pharynx: Oropharynx is clear.  Eyes:     Extraocular Movements: Extraocular movements intact.     Conjunctiva/sclera: Conjunctivae normal.  Cardiovascular:     Rate and Rhythm: Normal rate and regular rhythm.     Heart sounds: Normal heart sounds. No murmur heard. Pulmonary:     Effort: Pulmonary effort is normal. No respiratory distress.     Breath  sounds: Normal breath sounds.  Abdominal:     General: Abdomen is flat. There is no distension.     Palpations: Abdomen is soft.     Tenderness: There is abdominal tenderness in the left lower quadrant. There is guarding. There is no right CVA tenderness or left CVA tenderness. Negative signs include Murphy's sign and McBurney's sign.     Comments: Patient reports left sided pelvic pain that is better with constant pressure  Musculoskeletal:        General: No swelling.     Cervical back: Neck supple.  Skin:    General: Skin is warm and dry.     Capillary Refill: Capillary refill takes less than 2 seconds.  Neurological:     General: No focal deficit present.     Mental Status: She is alert and oriented to person, place, and time.  Psychiatric:        Mood and Affect: Mood normal.     ED Results / Procedures / Treatments   Labs (all labs ordered are listed, but only abnormal results are displayed) Labs Reviewed  URINALYSIS, ROUTINE W REFLEX MICROSCOPIC - Abnormal; Notable for the following components:      Result Value   Color, Urine COLORLESS (*)    Specific Gravity, Urine 1.002 (*)    All other components within normal limits  LIPASE, BLOOD  COMPREHENSIVE METABOLIC PANEL WITH GFR  HCG, SERUM, QUALITATIVE  CBC WITH DIFFERENTIAL/PLATELET    EKG None  Radiology CT ABDOMEN PELVIS W CONTRAST Result Date: 09/17/2023 CLINICAL DATA:  LLQ abdominal pain EXAM: CT ABDOMEN AND PELVIS WITH CONTRAST TECHNIQUE: Multidetector CT imaging of the abdomen and pelvis was performed using the standard protocol following bolus administration of intravenous contrast. RADIATION DOSE REDUCTION: This exam was performed according to the departmental dose-optimization program which includes automated exposure control, adjustment of the mA and/or kV according to patient size and/or use of iterative reconstruction technique. CONTRAST:  OMNIPAQUE IOHEXOL 300 MG/ML  SOLN COMPARISON:  July 27, 2013, Sep 17, 2023 FINDINGS: Lower chest: No focal airspace consolidation or pleural effusion. Hepatobiliary: No mass.Diffuse hepatic steatosis.No radiopaque stones or wall thickening of the gallbladder. No intrahepatic or extrahepatic biliary ductal dilation. The portal veins are patent. Pancreas: No mass or main ductal dilation. No peripancreatic inflammation or fluid collection. Spleen: Normal size. No mass. Adrenals/Urinary Tract: No adrenal masses. No renal mass. No nephrolithiasis or hydronephrosis. The urinary bladder is completely decompressed. Stomach/Bowel: The stomach is decompressed without focal abnormality. No small bowel wall thickening or inflammation. No small bowel obstruction.Normal appendix. Vascular/Lymphatic: No aortic aneurysm. No intraabdominal or pelvic lymphadenopathy. Reproductive: 2.1 cm dominant left ovarian follicle.Trace, likely physiologic free pelvic fluid. Other: No pneumoperitoneum, ascites, or mesenteric inflammation. Musculoskeletal: No acute fracture or destructive lesion.  Early degenerative changes at L2-L3. IMPRESSION: 1. No acute intraabdominal or pelvic abnormality. 2. Dominant follicle in the left ovary measuring 2.1 cm. Trace free pelvic fluid, likely physiologic in a female of this age. Electronically Signed   By: Rance Burrows M.D.   On: 09/17/2023 20:07   US  Pelvis Complete Result Date: 09/17/2023 CLINICAL DATA:  28 year old female with pelvic pain.  Uncertain LMP. EXAM: TRANSABDOMINAL AND TRANSVAGINAL ULTRASOUND OF PELVIS DOPPLER ULTRASOUND OF OVARIES TECHNIQUE: Both transabdominal and transvaginal ultrasound examinations of the pelvis were performed. Transabdominal technique was performed for global imaging of the pelvis including uterus, ovaries, adnexal regions, and pelvic cul-de-sac. It was necessary to proceed with endovaginal exam following the transabdominal exam to visualize the endometrium and adnexa. Color and duplex Doppler ultrasound was utilized to evaluate  blood flow to the ovaries. COMPARISON:  03/05/2018 pelvic sonogram. FINDINGS: Uterus Measurements: 7.3 x 3.2 x 4.6 cm = volume: 55 mL. Anteverted uterus is normal in size and configuration, with no uterine fibroids or other myometrial abnormalities. Endometrium Thickness: 11 mm. No endometrial cavity fluid or focal endometrial mass. Right ovary Right ovary is surgically absent.  No right adnexal mass. Left ovary Measurements: 4.5 x 2.9 x 2.8 cm = volume: 19.1 mL. Simple 2.1 x 2.1 x 1.7 cm left ovarian cyst. Pulsed Doppler evaluation of the left ovary demonstrates normal low-resistance arterial and venous waveforms. Other findings No abnormal free fluid. IMPRESSION: 1. No evidence of left adnexal torsion. 2. Simple 2.1 cm left ovarian cyst. No follow-up imaging recommended. 3. Surgically absent right ovary. 4. Normal uterus and endometrium. Electronically Signed   By: Levell Reach M.D.   On: 09/17/2023 17:25   US  Transvaginal Non-OB Result Date: 09/17/2023 CLINICAL DATA:  28 year old female with pelvic pain.  Uncertain LMP. EXAM: TRANSABDOMINAL AND TRANSVAGINAL ULTRASOUND OF PELVIS DOPPLER ULTRASOUND OF OVARIES TECHNIQUE: Both transabdominal and transvaginal ultrasound examinations of the pelvis were performed. Transabdominal technique was performed for global imaging of the pelvis including uterus, ovaries, adnexal regions, and pelvic cul-de-sac. It was necessary to proceed with endovaginal exam following the transabdominal exam to visualize the endometrium and adnexa. Color and duplex Doppler ultrasound was utilized to evaluate blood flow to the ovaries. COMPARISON:  03/05/2018 pelvic sonogram. FINDINGS: Uterus Measurements: 7.3 x 3.2 x 4.6 cm = volume: 55 mL. Anteverted uterus is normal in size and configuration, with no uterine fibroids or other myometrial abnormalities. Endometrium Thickness: 11 mm. No endometrial cavity fluid or focal endometrial mass. Right ovary Right ovary is surgically absent.  No right  adnexal mass. Left ovary Measurements: 4.5 x 2.9 x 2.8 cm = volume: 19.1 mL. Simple 2.1 x 2.1 x 1.7 cm left ovarian cyst. Pulsed Doppler evaluation of the left ovary demonstrates normal low-resistance arterial and venous waveforms. Other findings No abnormal free fluid. IMPRESSION: 1. No evidence of left adnexal torsion. 2. Simple 2.1 cm left ovarian cyst. No follow-up imaging recommended. 3. Surgically absent right ovary. 4. Normal uterus and endometrium. Electronically Signed   By: Levell Reach M.D.   On: 09/17/2023 17:25   US  Art/Ven Flow Abd Pelv Doppler Result Date: 09/17/2023 CLINICAL DATA:  28 year old female with pelvic pain.  Uncertain LMP. EXAM: TRANSABDOMINAL AND TRANSVAGINAL ULTRASOUND OF PELVIS DOPPLER ULTRASOUND OF OVARIES TECHNIQUE: Both transabdominal and transvaginal ultrasound examinations of the pelvis were performed. Transabdominal technique was performed for global imaging of the pelvis including uterus, ovaries, adnexal regions, and pelvic cul-de-sac. It was necessary to proceed with endovaginal exam following the transabdominal exam to visualize  the endometrium and adnexa. Color and duplex Doppler ultrasound was utilized to evaluate blood flow to the ovaries. COMPARISON:  03/05/2018 pelvic sonogram. FINDINGS: Uterus Measurements: 7.3 x 3.2 x 4.6 cm = volume: 55 mL. Anteverted uterus is normal in size and configuration, with no uterine fibroids or other myometrial abnormalities. Endometrium Thickness: 11 mm. No endometrial cavity fluid or focal endometrial mass. Right ovary Right ovary is surgically absent.  No right adnexal mass. Left ovary Measurements: 4.5 x 2.9 x 2.8 cm = volume: 19.1 mL. Simple 2.1 x 2.1 x 1.7 cm left ovarian cyst. Pulsed Doppler evaluation of the left ovary demonstrates normal low-resistance arterial and venous waveforms. Other findings No abnormal free fluid. IMPRESSION: 1. No evidence of left adnexal torsion. 2. Simple 2.1 cm left ovarian cyst. No follow-up imaging  recommended. 3. Surgically absent right ovary. 4. Normal uterus and endometrium. Electronically Signed   By: Levell Reach M.D.   On: 09/17/2023 17:25    Procedures Procedures    Medications Ordered in ED Medications  morphine (PF) 4 MG/ML injection 4 mg (4 mg Intravenous Given 09/17/23 1311)  ondansetron  (ZOFRAN ) injection 4 mg (4 mg Intravenous Given 09/17/23 1311)  diphenhydrAMINE (BENADRYL) injection 25 mg (25 mg Intravenous Given 09/17/23 1339)  fentaNYL  (SUBLIMAZE ) injection 50 mcg (50 mcg Intravenous Given 09/17/23 1843)  iohexol (OMNIPAQUE) 300 MG/ML solution 100 mL (100 mLs Intravenous Contrast Given 09/17/23 1923)    ED Course/ Medical Decision Making/ A&P                                 Medical Decision Making Amount and/or Complexity of Data Reviewed Labs: ordered. Radiology: ordered.  Risk Prescription drug management.   This patient presents to the ED for concern of abdominal pain differential diagnosis includes pancreatitis, appendicitis, choledocholithiasis, cholecystitis, diverticulitis, ulcerative colitis, viral GI illness, ovarian torsion, ovarian cyst, ectopic pregnancy    Additional history obtained Additional history obtained from Electronic Medical Record External records from outside source obtained and reviewed including cardiology progress notes   Lab Tests:  I Ordered, and personally interpreted labs.  The pertinent results include: UA with low specific gravity, CBC, CMP, lipase WNL, negative pregnancy   Imaging Studies ordered:  I ordered imaging studies including ultrasound torsion rule out I independently visualized and interpreted imaging which showed no evidence of left adnexal torsion.  Simple 2.1 cm left ovarian cyst. I agree with the radiologist interpretation CT abdomen pelvis with contrast no acute intra-abdominal or pelvic abnormality.  Dominant follicle in the left ovary measuring 2.1 cm.  Trace free pelvic fluid likely  physiologic.   Medicines ordered and prescription drug management:  I ordered medication including morphine, Zofran , Benadryl, fentanyl     I have reviewed the patients home medicines and have made adjustments as needed   Problem List / ED Course:  Considered for admission further workup however patient's vital signs, physical exam, labs, imaging and been reassuring.  Patient is feeling much better on reassessment.  Patient given return precautions.  Patient to follow-up with OB/GYN if her symptoms persist.  I feel patient is safe for discharge at this time.          Final Clinical Impression(s) / ED Diagnoses Final diagnoses:  Abdominal pain, unspecified abdominal location    Rx / DC Orders ED Discharge Orders     None         Carie Charity, PA-C 09/17/23 2034  Albertus Hughs, DO 09/19/23 [phone]

## 2023-09-18 DIAGNOSIS — F908 Attention-deficit hyperactivity disorder, other type: Secondary | ICD-10-CM | POA: Diagnosis not present

## 2023-09-20 ENCOUNTER — Other Ambulatory Visit

## 2023-09-20 ENCOUNTER — Other Ambulatory Visit: Payer: Self-pay | Admitting: Neurology

## 2023-09-23 ENCOUNTER — Other Ambulatory Visit (HOSPITAL_COMMUNITY): Payer: Self-pay

## 2023-09-23 ENCOUNTER — Other Ambulatory Visit

## 2023-09-23 ENCOUNTER — Other Ambulatory Visit (HOSPITAL_BASED_OUTPATIENT_CLINIC_OR_DEPARTMENT_OTHER): Payer: Self-pay

## 2023-09-23 MED ORDER — VITAMIN D (ERGOCALCIFEROL) 50000 UNITS PO CAPS
50000.0000 [IU] | ORAL_CAPSULE | ORAL | 0 refills | Status: AC
  Filled 2023-09-23: qty 7, 49d supply, fill #0

## 2023-09-26 ENCOUNTER — Other Ambulatory Visit: Payer: Self-pay

## 2023-09-30 ENCOUNTER — Other Ambulatory Visit: Payer: Self-pay

## 2023-10-01 ENCOUNTER — Encounter: Payer: Self-pay | Admitting: *Deleted

## 2023-10-02 DIAGNOSIS — F908 Attention-deficit hyperactivity disorder, other type: Secondary | ICD-10-CM | POA: Diagnosis not present

## 2023-10-07 DIAGNOSIS — F908 Attention-deficit hyperactivity disorder, other type: Secondary | ICD-10-CM | POA: Diagnosis not present

## 2023-10-14 DIAGNOSIS — F908 Attention-deficit hyperactivity disorder, other type: Secondary | ICD-10-CM | POA: Diagnosis not present

## 2023-10-15 ENCOUNTER — Other Ambulatory Visit: Payer: Self-pay | Admitting: Neurology

## 2023-10-15 ENCOUNTER — Other Ambulatory Visit (HOSPITAL_COMMUNITY): Payer: Self-pay

## 2023-10-15 MED ORDER — DULOXETINE HCL 30 MG PO CPEP
30.0000 mg | ORAL_CAPSULE | Freq: Every day | ORAL | 5 refills | Status: AC
Start: 2023-10-15 — End: ?
  Filled 2023-10-15: qty 30, 30d supply, fill #0
  Filled 2023-11-15: qty 30, 30d supply, fill #1

## 2023-10-18 ENCOUNTER — Other Ambulatory Visit (HOSPITAL_COMMUNITY): Payer: Self-pay

## 2023-10-24 ENCOUNTER — Other Ambulatory Visit (HOSPITAL_COMMUNITY): Payer: Self-pay

## 2023-10-24 ENCOUNTER — Other Ambulatory Visit: Payer: Self-pay

## 2023-10-28 ENCOUNTER — Other Ambulatory Visit: Payer: Self-pay

## 2023-10-28 DIAGNOSIS — F908 Attention-deficit hyperactivity disorder, other type: Secondary | ICD-10-CM | POA: Diagnosis not present

## 2023-10-30 ENCOUNTER — Other Ambulatory Visit (HOSPITAL_COMMUNITY): Payer: Self-pay

## 2023-11-11 DIAGNOSIS — F908 Attention-deficit hyperactivity disorder, other type: Secondary | ICD-10-CM | POA: Diagnosis not present

## 2023-11-12 ENCOUNTER — Other Ambulatory Visit: Payer: Self-pay

## 2023-11-18 DIAGNOSIS — F908 Attention-deficit hyperactivity disorder, other type: Secondary | ICD-10-CM | POA: Diagnosis not present

## 2023-11-19 ENCOUNTER — Other Ambulatory Visit (HOSPITAL_COMMUNITY): Payer: Self-pay

## 2023-11-19 ENCOUNTER — Encounter: Payer: Self-pay | Admitting: Neurology

## 2023-11-19 ENCOUNTER — Ambulatory Visit: Admitting: Neurology

## 2023-11-19 ENCOUNTER — Other Ambulatory Visit (HOSPITAL_BASED_OUTPATIENT_CLINIC_OR_DEPARTMENT_OTHER): Payer: Self-pay

## 2023-11-19 VITALS — BP 112/67 | HR 67 | Ht 68.0 in | Wt 155.0 lb

## 2023-11-19 DIAGNOSIS — G35 Multiple sclerosis: Secondary | ICD-10-CM

## 2023-11-19 DIAGNOSIS — R269 Unspecified abnormalities of gait and mobility: Secondary | ICD-10-CM

## 2023-11-19 DIAGNOSIS — E538 Deficiency of other specified B group vitamins: Secondary | ICD-10-CM

## 2023-11-19 DIAGNOSIS — E559 Vitamin D deficiency, unspecified: Secondary | ICD-10-CM

## 2023-11-19 DIAGNOSIS — G43709 Chronic migraine without aura, not intractable, without status migrainosus: Secondary | ICD-10-CM | POA: Diagnosis not present

## 2023-11-19 MED ORDER — DULOXETINE HCL 60 MG PO CPEP
60.0000 mg | ORAL_CAPSULE | Freq: Every day | ORAL | 3 refills | Status: AC
  Filled 2023-11-19: qty 90, 90d supply, fill #0
  Filled 2024-02-17: qty 90, 90d supply, fill #1
  Filled 2024-05-14: qty 90, 90d supply, fill #2

## 2023-11-19 NOTE — Progress Notes (Signed)
 Chief Complaint  Patient presents with   Migraine    Room 14 Pt is alone  M S , she is not using cane and she stated that she has couple of flare up      ASSESSMENT AND PLAN  Kelly Morton is a 28 y.o. female   Relapsing remitting multiple sclerosis, Gait abnormality  Continue Ocrevus  infusion every 6 months - next infusion 08/2023, initial infusion 02/2023  Obtain IgG, IgA and IgM,    Continue gabapentin  900 mg 2-3 times daily for pain  Continue modafinil  100 mg twice daily for fatigue,     Will prescribe Xanax  to take prior to MRI due to claustrophobia, is aware to have a driver with her Lumbar puncture benign  Chronic migraine headaches Depression anxiety  Currently well-controlled Increase duloxetine  60 mg daily, decrease Lexapro  from 20 to 10 mg, may consider tapering of, Maxalt  as needed    DIAGNOSTIC DATA (LABS, IMAGING, TESTING) - I reviewed patient records, labs, notes, testing and imaging myself where available.   MEDICAL HISTORY:    Kelly Morton is a 28 year old female, seen in request by her primary care physician Dr. Marlee, Lynwood B for evaluation of left arm facial paresthesia gait abnormality initial evaluation was on January 04, 2023  I reviewed and summarized the referring note. PMHX Anxiety Chronic migraine PCOS  On December 12, 2022, she began to feel left lateral arm paresthesia, tingling, 3 days later on December 15, 2022, she noticed needle prick sensation involving her left face, scalp, chin area,  On December 27, 2022, she felt dizzy, then noticed gait abnormality, which is persistent since then, fell few times,  January 02, 2023, she began to noticed symmetric bilateral plantar feet numbness, denies bowel and bladder incontinence  When she bent down her neck, she felt radiating discomfort from mid back to sacral region  She had a history of left lateral thigh shingle in June 2021,  rash involving left L4 myotomes then.  She  presented to the emergency room December 17, 2022, personally reviewed CT head without contrast was normal Labs showed low B12 221, began to receiving B12 injection, otherwise normal CBC CMP  Diagnosis of MS was confirmed by abnormal MRIs, with MS lesions involving left medullary, upper cervical left cerebellum periventricular, parietal, corpus callosum, enhancing lesions   MRI of the brain with without contrast showed multiple T2/FLAIR lesions involving left medulla, cerebellum, bilateral periventricular, periatrial region, corpus callosum, multiple enhancing lesions,  MRI of cervical spine showed spinal cord hyperintensity at C2-3 on the left eventually at C5, distant with active demyelinating plaque  MRI of the thoracic spine also showed T3-4, T9 with postcontrast enhancement, consistent with active demyelinating plaque  Extensive laboratory evaluation showed normal or negative CMP, CBC, hepatitis panel, ANA, IgE, protein electrophoresis, vitamin D , folic acid, TSH, C-reactive protein, RPR, varicella-zoster antibody, QuantiFERON TB, JC virus was negative, vitamin D  level was 16, ferritin was 9, on supplement   Spinal fluid testing October 2024: Bloody tap,  She started Ocrevus  infusion in December 2024, overall tolerating it well, most recent was in May 2025  Posttreatment MRIs in April 2025 reviewed MRI of brain no enhancing lesion noted, compared to MRI in September 2025, left retrolentiform lesion appears to be new, MRI cervical spine, spinal cord hyperintensity at the C2-3 on left ventrally at C5, MRI of thoracic spine, cord involvement at T3 for T9  She continues to complain mild gait abnormality, fatigue, depression anxiety, chronic migraine,   PHYSICAL  EXAMNIATION:     11/19/2023   10:25 AM 09/17/2023    8:00 PM 09/17/2023    6:00 PM  Vitals with BMI  Height 5' 8    Weight 155 lbs    BMI 23.57    Systolic 112 122 877  Diastolic 67 84 84  Pulse 67 65 76     Gen: NAD, very  pleasant young Caucasian female, conversant, well nourised, well groomed                     Cardiovascular: Regular rate rhythm, no peripheral edema, warm, nontender. Eyes: Conjunctivae clear without exudates or hemorrhage Neck: Supple, no carotid bruits. Pulmonary: Clear to auscultation bilaterally   NEUROLOGICAL EXAM:  MENTAL STATUS: Speech/cognition: Awake, alert oriented to history taking and casual conversation  CRANIAL NERVES: CN II: Visual fields are full to confrontation.  Pupils are round equal and briskly reactive to light. CN III, IV, VI: extraocular movement are normal, does have 2-3 beat nystagmus looking horizontally but not sustained. No ptosis. CN V: Facial sensation is intact to pinprick in all 3 divisions bilaterally. Corneal responses are intact.  CN VII: Face is symmetric with normal eye closure and smile. CN VIII: Hearing is normal to casual conversation CN IX, X: Palate elevates symmetrically. Phonation is normal. CN XI: Head turning and shoulder shrug are intact CN XII: Tongue is midline with normal movements and no atrophy.  MOTOR: There is no pronator drift of out-stretched arms. Muscle bulk and tone are normal. Muscle strength is normal.  REFLEXES: Reflexes are 3 and symmetric at the biceps, triceps, knees, and ankles. Plantar responses are extensor bilaterally  SENSORY: Intact to light touch, pinprick, positional and vibratory sensation are intact in fingers and toes.  COORDINATION: Rapid alternating movements and fine finger movements are intact. There is no dysmetria on finger-to-nose and heel-knee-shin.    GAIT/STANCE: Push-up, mildly unsteady    REVIEW OF SYSTEMS:  Full 14 system review of systems performed and notable only for as above All other review of systems were negative.   ALLERGIES: Allergies  Allergen Reactions   Fire Ant Anaphylaxis   Bactrim [Sulfamethoxazole-Trimethoprim] Hives   Elemental Sulfur Hives   Morphine  Other (See  Comments)   Sulfur Hives    HOME MEDICATIONS: Current Outpatient Medications  Medication Sig Dispense Refill   acetaminophen  (TYLENOL ) 325 MG tablet Take 650 mg by mouth every 6 (six) hours as needed for mild pain.     ALPRAZolam  (XANAX ) 1 MG tablet Take 1 tablets 30 minutes prior to MRI 1 tablet 0   aspirin -acetaminophen -caffeine  (EXCEDRIN  MIGRAINE) 250-250-65 MG tablet Take 3 tablets by mouth every 6 (six) hours as needed for headache or migraine.      cyanocobalamin  (VITAMIN B12) 1000 MCG tablet Take 1 tablet (1,000 mcg total) by mouth daily. 90 tablet 3   DULoxetine  (CYMBALTA ) 30 MG capsule Take 1 capsule (30 mg total) by mouth daily. 30 capsule 5   escitalopram  (LEXAPRO ) 20 MG tablet Take 1 tablet (20 mg total) by mouth daily. 90 tablet 3   fluticasone  (FLONASE ) 50 MCG/ACT nasal spray Place 2 sprays into both nostrils daily. 16 g 3   gabapentin  (NEURONTIN ) 300 MG capsule Take 3 capsules (900 mg total) by mouth 3 (three) times daily. 270 capsule 11   ibuprofen  (ADVIL ) 600 MG tablet Take 1 tablet (600 mg total) by mouth every 6 (six) hours as needed for moderate pain or cramping. 90 tablet 3   metFORMIN  (GLUCOPHAGE ) 500 MG  tablet Take 1 tablet (500 mg total) by mouth 2 (two) times daily with a meal. 180 tablet 3   modafinil  (PROVIGIL ) 100 MG tablet Take 1 tablet (100 mg total) by mouth 2 (two) times daily. 60 tablet 5   ondansetron  (ZOFRAN -ODT) 4 MG disintegrating tablet Take 1 tablet (4 mg total) by mouth every 8 (eight) hours as needed for nausea or vomiting. 30 tablet 0   rizatriptan  (MAXALT -MLT) 10 MG disintegrating tablet Take 1 tablet (10 mg total) by mouth as needed. May repeat in 2 hours if needed 12 tablet 6   triamcinolone  ointment (KENALOG ) 0.1 % Apply topically 2 (two) times daily. 454 g 0   Vitamin D , Ergocalciferol , 50000 units CAPS Take 1 capsule (50,000 Units) by mouth once a week. 7 capsule 0   No current facility-administered medications for this visit.    PAST MEDICAL  HISTORY: Past Medical History:  Diagnosis Date   H. pylori infection 08/28/2020   Injury of meniscus of left knee 12/16/2021   Ovarian cyst    PTSD (post-traumatic stress disorder)    Sore throat 05/18/2020   Syncope     PAST SURGICAL HISTORY: Past Surgical History:  Procedure Laterality Date   IR LUMBAR PUNCTURE  02/15/2023   OOPHORECTOMY     TONSILLECTOMY      FAMILY HISTORY: Family History  Problem Relation Age of Onset   Hypertension Mother    Stroke Mother    Hypertension Father    Cervical cancer Maternal Grandmother    Prostate cancer Maternal Grandfather    Lymphoma Maternal Grandfather    Diabetes Paternal Grandmother     SOCIAL HISTORY: Social History   Socioeconomic History   Marital status: Significant Other    Spouse name: emily (luda)   Number of children: 0   Years of education: Not on file   Highest education level: Bachelor's degree (e.g., BA, AB, BS)  Occupational History   Not on file  Tobacco Use   Smoking status: Never    Passive exposure: Never   Smokeless tobacco: Never  Vaping Use   Vaping status: Never Used  Substance and Sexual Activity   Alcohol use: Yes    Alcohol/week: 7.0 standard drinks of alcohol    Types: 6 Glasses of wine, 1 Cans of beer per week    Comment: rare   Drug use: Never   Sexual activity: Yes    Comment: same sex relationship  Other Topics Concern   Not on file  Social History Narrative   Not on file   Social Drivers of Health   Financial Resource Strain: Low Risk  (06/28/2023)   Overall Financial Resource Strain (CARDIA)    Difficulty of Paying Living Expenses: Not very hard  Food Insecurity: No Food Insecurity (06/28/2023)   Hunger Vital Sign    Worried About Running Out of Food in the Last Year: Never true    Ran Out of Food in the Last Year: Never true  Transportation Needs: No Transportation Needs (06/28/2023)   PRAPARE - Administrator, Civil Service (Medical): No    Lack of Transportation  (Non-Medical): No  Physical Activity: Inactive (06/28/2023)   Exercise Vital Sign    Days of Exercise per Week: 0 days    Minutes of Exercise per Session: 30 min  Stress: Stress Concern Present (06/28/2023)   [employer] of Occupational Health - Occupational Stress Questionnaire    Feeling of Stress : Very much  Social Connections: Moderately Isolated (06/28/2023)  Social Advertising account executive    Frequency of Communication with Friends and Family: Twice a week    Frequency of Social Gatherings with Friends and Family: Once a week    Attends Religious Services: Never    Database administrator or Organizations: No    Attends Engineer, structural: Not on file    Marital Status: Living with partner  Intimate Partner Violence: Not on file     Modena Callander, M.D. Ph.D.  [facility] [address] [place], [place] [address] Phone: [phone] Fax:      [phone]

## 2023-11-20 ENCOUNTER — Other Ambulatory Visit (HOSPITAL_COMMUNITY): Payer: Self-pay

## 2023-11-21 ENCOUNTER — Ambulatory Visit: Payer: Self-pay | Admitting: Neurology

## 2023-11-21 LAB — IGG, IGA, IGM
IgA/Immunoglobulin A, Serum: 98 mg/dL (ref 87–352)
IgG (Immunoglobin G), Serum: 821 mg/dL (ref 586–1602)
IgM (Immunoglobulin M), Srm: 39 mg/dL (ref 26–217)

## 2023-11-21 LAB — CD20 B CELLS
% CD19-B Cells: 0 — AB (ref 4.6–22.1)
% CD20-B Cells: 0 — AB (ref 5.0–22.3)

## 2023-11-25 DIAGNOSIS — F908 Attention-deficit hyperactivity disorder, other type: Secondary | ICD-10-CM | POA: Diagnosis not present

## 2023-11-27 ENCOUNTER — Other Ambulatory Visit: Payer: Self-pay

## 2023-12-04 ENCOUNTER — Other Ambulatory Visit (HOSPITAL_COMMUNITY): Payer: Self-pay

## 2023-12-09 DIAGNOSIS — F908 Attention-deficit hyperactivity disorder, other type: Secondary | ICD-10-CM | POA: Diagnosis not present

## 2023-12-17 DIAGNOSIS — F908 Attention-deficit hyperactivity disorder, other type: Secondary | ICD-10-CM | POA: Diagnosis not present

## 2023-12-27 ENCOUNTER — Other Ambulatory Visit: Payer: Self-pay

## 2023-12-31 DIAGNOSIS — F908 Attention-deficit hyperactivity disorder, other type: Secondary | ICD-10-CM | POA: Diagnosis not present

## 2024-01-14 DIAGNOSIS — F908 Attention-deficit hyperactivity disorder, other type: Secondary | ICD-10-CM | POA: Diagnosis not present

## 2024-01-21 ENCOUNTER — Telehealth: Payer: Self-pay | Admitting: Neurology

## 2024-01-21 DIAGNOSIS — F908 Attention-deficit hyperactivity disorder, other type: Secondary | ICD-10-CM | POA: Diagnosis not present

## 2024-01-21 NOTE — Telephone Encounter (Signed)
 Her TB QuantiFERON was negative in September 2024, if she needs another 1, we can reorder it

## 2024-01-21 NOTE — Telephone Encounter (Signed)
 Patient said, got my flu shot; [facility] no longer doing TB testing for someone that is immune compromised. Was advised to contact neurologist to see if she wanted me to be tested. Would like a call from the nurse.

## 2024-01-27 ENCOUNTER — Other Ambulatory Visit: Payer: Self-pay

## 2024-01-27 ENCOUNTER — Other Ambulatory Visit: Payer: Self-pay | Admitting: Neurology

## 2024-01-28 ENCOUNTER — Other Ambulatory Visit (HOSPITAL_COMMUNITY): Payer: Self-pay

## 2024-01-28 MED ORDER — MODAFINIL 100 MG PO TABS
100.0000 mg | ORAL_TABLET | Freq: Two times a day (BID) | ORAL | 5 refills | Status: AC
  Filled 2024-01-28: qty 60, 30d supply, fill #0
  Filled 2024-02-25: qty 60, 30d supply, fill #1
  Filled 2024-03-26: qty 60, 30d supply, fill #2
  Filled 2024-03-26: qty 26, 13d supply, fill #2
  Filled 2024-03-26: qty 34, 17d supply, fill #2
  Filled 2024-04-27: qty 60, 30d supply, fill #3
  Filled 2024-05-27: qty 60, 30d supply, fill #4

## 2024-01-28 NOTE — Telephone Encounter (Signed)
 Requested Prescriptions   Pending Prescriptions Disp Refills   modafinil  (PROVIGIL ) 100 MG tablet 60 tablet 5    Sig: Take 1 tablet (100 mg total) by mouth 2 (two) times daily.   Last seen 11/19/23 Next appt scheduled 06/09/23 Dispenses   Dispensed Days Supply Quantity Provider Pharmacy  modafinil  (PROVIGIL ) 100 MG tablet 12/27/2023 30 60 tablet Onita Duos, MD [place] [facility]  modafinil  (PROVIGIL ) 100 MG tablet 11/29/2023 30 60 tablet Onita Duos, MD [place] [facility]  modafinil  (PROVIGIL ) 100 MG tablet 10/30/2023 30 60 tablet Onita Duos, MD [place] [facility]  modafinil  (PROVIGIL ) 100 MG tablet 10/01/2023 30 60 tablet Onita Duos, MD [place] [facility]  modafinil  (PROVIGIL ) 100 MG tablet 08/30/2023 30 60 tablet Onita Duos, MD [place] [facility]  modafinil  (PROVIGIL ) 100 MG tablet 08/01/2023 30 60 tablet Onita Duos, MD [place] [facility]  modafinil  (PROVIGIL ) 100 MG tablet 07/01/2023 30 60 tablet Onita Duos, MD [place] [facility]  modafinil  (PROVIGIL ) 100 MG tablet 05/31/2023 30 60 tablet Onita Duos, MD [place] [facility]  modafinil  (PROVIGIL ) 100 MG tablet 05/03/2023 30 60 tablet Onita Duos, MD [place] [facility]  modafinil  (PROVIGIL ) 100 MG tablet 04/03/2023 30 60 tablet Onita Duos, MD  - [facility]  modafinil  (PROVIGIL ) 100 MG tablet 03/04/2023 30 60 tablet Onita Duos, MD  - [facility][email or web address]

## 2024-01-29 ENCOUNTER — Other Ambulatory Visit: Payer: Self-pay | Admitting: Adult Health

## 2024-01-29 ENCOUNTER — Other Ambulatory Visit (HOSPITAL_COMMUNITY): Payer: Self-pay

## 2024-01-29 DIAGNOSIS — R112 Nausea with vomiting, unspecified: Secondary | ICD-10-CM

## 2024-01-29 MED ORDER — METFORMIN HCL 500 MG PO TABS
500.0000 mg | ORAL_TABLET | Freq: Two times a day (BID) | ORAL | 0 refills | Status: DC
  Filled 2024-01-29: qty 180, 90d supply, fill #0

## 2024-01-29 NOTE — Telephone Encounter (Signed)
 This medication is being prescribed by PCP. Please route to PCP for refill.

## 2024-02-11 DIAGNOSIS — F908 Attention-deficit hyperactivity disorder, other type: Secondary | ICD-10-CM | POA: Diagnosis not present

## 2024-02-12 ENCOUNTER — Telehealth: Payer: Self-pay

## 2024-02-12 DIAGNOSIS — Z0289 Encounter for other administrative examinations: Secondary | ICD-10-CM

## 2024-02-12 NOTE — Telephone Encounter (Signed)
 Received FMLA form from [employer]. Mychart message sent to patient.

## 2024-02-17 ENCOUNTER — Other Ambulatory Visit: Payer: Self-pay

## 2024-02-19 ENCOUNTER — Other Ambulatory Visit (HOSPITAL_COMMUNITY): Payer: Self-pay

## 2024-02-19 ENCOUNTER — Other Ambulatory Visit: Payer: Self-pay | Admitting: Neurology

## 2024-02-19 DIAGNOSIS — R112 Nausea with vomiting, unspecified: Secondary | ICD-10-CM

## 2024-02-19 MED ORDER — ONDANSETRON 4 MG PO TBDP
4.0000 mg | ORAL_TABLET | Freq: Three times a day (TID) | ORAL | 0 refills | Status: DC | PRN
Start: 2024-02-19 — End: 2024-02-26
  Filled 2024-02-19: qty 30, 10d supply, fill #0

## 2024-02-24 NOTE — Telephone Encounter (Signed)
 Patient said FMLA was denied because [employer] never received the forms. Patient requesting a call back from Medical Records and to be transferred to leave detailed message.

## 2024-02-24 NOTE — Telephone Encounter (Signed)
 Questionnaire received from patient. Form brought to POD 1 for completion

## 2024-02-24 NOTE — Telephone Encounter (Signed)
 It looks like the patient paid the form fee on 10/22 but medical records was not notified. We also never received the FMLA questionnaire back from patient.   I spoke with patient and she is going to email over the completed questionnaire

## 2024-02-24 NOTE — Telephone Encounter (Signed)
 Form completed and waiting on Dr. Onita signature.

## 2024-02-25 ENCOUNTER — Other Ambulatory Visit: Payer: Self-pay

## 2024-02-25 DIAGNOSIS — F908 Attention-deficit hyperactivity disorder, other type: Secondary | ICD-10-CM | POA: Diagnosis not present

## 2024-02-25 NOTE — Telephone Encounter (Signed)
 Gave completed/signed form back to medical records to process for pt.

## 2024-02-26 ENCOUNTER — Other Ambulatory Visit (HOSPITAL_COMMUNITY): Payer: Self-pay

## 2024-02-26 ENCOUNTER — Other Ambulatory Visit: Payer: Self-pay | Admitting: Neurology

## 2024-02-26 DIAGNOSIS — E538 Deficiency of other specified B group vitamins: Secondary | ICD-10-CM

## 2024-02-26 DIAGNOSIS — R112 Nausea with vomiting, unspecified: Secondary | ICD-10-CM

## 2024-02-26 DIAGNOSIS — F419 Anxiety disorder, unspecified: Secondary | ICD-10-CM

## 2024-02-27 ENCOUNTER — Other Ambulatory Visit (HOSPITAL_COMMUNITY): Payer: Self-pay

## 2024-02-27 MED ORDER — ONDANSETRON 4 MG PO TBDP: 4.0000 mg | ORAL_TABLET | Freq: Three times a day (TID) | ORAL | 0 refills | Status: AC | PRN

## 2024-02-27 MED ORDER — ESCITALOPRAM OXALATE 20 MG PO TABS
20.0000 mg | ORAL_TABLET | Freq: Every day | ORAL | 3 refills | Status: DC
  Filled 2024-02-27: qty 90, 90d supply, fill #0

## 2024-02-27 MED ORDER — VITAMIN B-12 1000 MCG PO TABS
1000.0000 ug | ORAL_TABLET | Freq: Every day | ORAL | 3 refills | Status: AC
  Filled 2024-02-27: qty 90, 90d supply, fill #0

## 2024-02-27 MED ORDER — IBUPROFEN 600 MG PO TABS
600.0000 mg | ORAL_TABLET | Freq: Four times a day (QID) | ORAL | 0 refills | Status: AC
  Filled 2024-02-27: qty 30, 8d supply, fill #0

## 2024-02-28 ENCOUNTER — Telehealth: Payer: Self-pay | Admitting: Neurology

## 2024-02-28 ENCOUNTER — Other Ambulatory Visit (HOSPITAL_COMMUNITY): Payer: Self-pay

## 2024-02-28 ENCOUNTER — Other Ambulatory Visit: Payer: Self-pay

## 2024-02-28 DIAGNOSIS — F419 Anxiety disorder, unspecified: Secondary | ICD-10-CM

## 2024-02-28 MED ORDER — ESCITALOPRAM OXALATE 10 MG PO TABS
10.0000 mg | ORAL_TABLET | Freq: Every day | ORAL | 3 refills | Status: AC
  Filled 2024-02-28: qty 30, 30d supply, fill #0

## 2024-02-28 NOTE — Telephone Encounter (Signed)
 Meds ordered this encounter  Medications   escitalopram  (LEXAPRO ) 10 MG tablet    Sig: Take 1 tablet (10 mg total) by mouth daily.    Dispense:  30 tablet    Refill:  3

## 2024-03-02 DIAGNOSIS — G35A Relapsing-remitting multiple sclerosis: Secondary | ICD-10-CM | POA: Diagnosis not present

## 2024-03-04 ENCOUNTER — Other Ambulatory Visit: Payer: Self-pay | Admitting: Neurology

## 2024-03-05 ENCOUNTER — Other Ambulatory Visit (HOSPITAL_COMMUNITY): Payer: Self-pay

## 2024-03-05 MED ORDER — GABAPENTIN 300 MG PO CAPS
900.0000 mg | ORAL_CAPSULE | Freq: Three times a day (TID) | ORAL | 0 refills | Status: AC
  Filled 2024-03-05: qty 270, 30d supply, fill #0

## 2024-03-05 NOTE — Telephone Encounter (Signed)
 Last seen on 11/19/23 Follow up schedule 06/08/24

## 2024-03-09 ENCOUNTER — Ambulatory Visit: Admitting: Plastic Surgery

## 2024-03-09 VITALS — BP 126/85 | HR 68 | Ht 68.0 in | Wt 148.0 lb

## 2024-03-09 DIAGNOSIS — R3911 Hesitancy of micturition: Secondary | ICD-10-CM | POA: Diagnosis not present

## 2024-03-09 NOTE — Progress Notes (Signed)
 Referring Provider Diona Perkins, MD [address] [place],  [place] [address]   CC:  Chief Complaint  Patient presents with   Consult    Consult Pann/ABD      Kelly Morton is an 28 y.o. female.  HPI: Kelly Morton, is a 28 year old female who self-referred today for evaluation of her anterior abdominal wall with consideration for abdominal wall contouring.  Patient was diagnosed with multiple sclerosis over a year ago.  She states that since beginning treatment for multiple sclerosis she has lost 80 pounds.  She does feel that she has a small amount of excess skin on her lower anterior abdominal wall.  More specifically though she is concerned that she is having more more difficulty beginning her stream with urination.  She is questioning whether excess weight on her bladder is the cause.  She states that it now takes approximately 6 minutes to begin her stream however the longest time she has noted with 17 minutes.  She is interested in what can be done.  Allergies  Allergen Reactions   Fire Ant Anaphylaxis   Bactrim [Sulfamethoxazole-Trimethoprim] Hives   Elemental Sulfur Hives   Morphine  Other (See Comments)   Sulfur Hives    Outpatient Encounter Medications as of 03/09/2024  Medication Sig Note   acetaminophen  (TYLENOL ) 325 MG tablet Take 650 mg by mouth every 6 (six) hours as needed for mild pain.    ALPRAZolam  (XANAX ) 1 MG tablet Take 1 tablets 30 minutes prior to MRI    aspirin -acetaminophen -caffeine  (EXCEDRIN  MIGRAINE) 250-250-65 MG tablet Take 3 tablets by mouth every 6 (six) hours as needed for headache or migraine.     cyanocobalamin  (VITAMIN B12) 1000 MCG tablet Take 1 tablet (1,000 mcg total) by mouth daily.    DULoxetine  (CYMBALTA ) 60 MG capsule Take 1 capsule (60 mg total) by mouth daily.    escitalopram  (LEXAPRO ) 10 MG tablet Take 1 tablet (10 mg total) by mouth daily.    fluticasone  (FLONASE ) 50 MCG/ACT nasal spray Place 2 sprays into both nostrils daily.     gabapentin  (NEURONTIN ) 300 MG capsule Take 3 capsules (900 mg total) by mouth 3 (three) times daily.    ibuprofen  (ADVIL ) 600 MG tablet Take 1 tablet (600 mg total) by mouth every 6 (six) hours as needed for moderate pain or cramping.    metFORMIN  (GLUCOPHAGE ) 500 MG tablet Take 1 tablet (500 mg total) by mouth 2 (two) times daily with a meal.    modafinil  (PROVIGIL ) 100 MG tablet Take 1 tablet (100 mg total) by mouth 2 (two) times daily.    ondansetron  (ZOFRAN -ODT) 4 MG disintegrating tablet Take 1 tablet (4 mg total) by mouth every 8 (eight) hours as needed for nausea or vomiting.    rizatriptan  (MAXALT -MLT) 10 MG disintegrating tablet Take 1 tablet (10 mg total) by mouth as needed. May repeat in 2 hours if needed    triamcinolone  ointment (KENALOG ) 0.1 % Apply topically 2 (two) times daily.    Vitamin D , Ergocalciferol , 50000 units CAPS Take 1 capsule (50,000 Units) by mouth once a week.    [DISCONTINUED] nortriptyline  (PAMELOR ) 10 MG capsule Take 2 capsules (20 mg total) by mouth at bedtime. 02/01/2023: PT STATES SHE WAS NEVER TAKING MED NOR IS ON THIS MEDICATION   No facility-administered encounter medications on file as of 03/09/2024.     Past Medical History:  Diagnosis Date   H. pylori infection 08/28/2020   Injury of meniscus of left knee 12/16/2021   Ovarian cyst  PTSD (post-traumatic stress disorder)    Sore throat 05/18/2020   Syncope     Past Surgical History:  Procedure Laterality Date   IR LUMBAR PUNCTURE  02/15/2023   OOPHORECTOMY     TONSILLECTOMY      Family History  Problem Relation Age of Onset   Hypertension Mother    Stroke Mother    Hypertension Father    Cervical cancer Maternal Grandmother    Prostate cancer Maternal Grandfather    Lymphoma Maternal Grandfather    Diabetes Paternal Grandmother     Social History   Social History Narrative   Not on file     Review of Systems General: Denies fevers, chills, weight loss CV: Denies chest pain,  shortness of breath, palpitations Abdomen: Patient is concerned that skin on her anterior abdominal wall may be interfering with her ability to start her stream while urinating.  Physical Exam    03/09/2024    8:29 AM 11/19/2023   10:25 AM 09/17/2023    8:00 PM  Vitals with BMI  Height 5' 8 5' 8   Weight 148 lbs 155 lbs   BMI 22.51 23.57   Systolic 126 112 877  Diastolic 85 67 84  Pulse 68 67 65    General:  No acute distress,  Alert and oriented, Non-Toxic, Normal speech and affect Abdomen: Patient has a very aesthetic appearing abdomen.  She has no pannus.  When she bends over there is a small amount of skin but as I told her this is normal for everybody. Mammogram: Not applicable Assessment/Plan Difficulty urinating: I had a long discussion with Kelly Morton this morning about her abdomen and about her difficulty urinating.  She has no skin or fat that could be removed with a panniculectomy and I would not perform an abdominoplasty as I could not improve the appearance of her abdomen with surgery.  The exceedingly small amount of tissue she has on the lower portion of her abdomen is not interfering in any way with her ability to urinate.  As I discussed with her that this seems to be something more likely related to her Kelly.  I have strongly encouraged that she return to her neurologist and discussed this.  Additionally I have offered to refer her to physical therapy to discuss pelvic floor and core strengthening.  She is interested so I will place the consult today.  She may follow-up with me as needed.  Leonce KATHEE [place] 03/09/2024, 8:50 AM

## 2024-03-10 DIAGNOSIS — F908 Attention-deficit hyperactivity disorder, other type: Secondary | ICD-10-CM | POA: Diagnosis not present

## 2024-03-12 DIAGNOSIS — F908 Attention-deficit hyperactivity disorder, other type: Secondary | ICD-10-CM | POA: Diagnosis not present

## 2024-03-16 DIAGNOSIS — F908 Attention-deficit hyperactivity disorder, other type: Secondary | ICD-10-CM | POA: Diagnosis not present

## 2024-03-23 DIAGNOSIS — F908 Attention-deficit hyperactivity disorder, other type: Secondary | ICD-10-CM | POA: Diagnosis not present

## 2024-03-26 ENCOUNTER — Other Ambulatory Visit: Payer: Self-pay

## 2024-03-26 ENCOUNTER — Other Ambulatory Visit: Payer: Self-pay | Admitting: Neurology

## 2024-03-26 ENCOUNTER — Other Ambulatory Visit (HOSPITAL_COMMUNITY): Payer: Self-pay

## 2024-03-26 ENCOUNTER — Encounter: Payer: Self-pay | Admitting: Neurology

## 2024-03-26 MED ORDER — RIZATRIPTAN BENZOATE 10 MG PO TBDP
10.0000 mg | ORAL_TABLET | ORAL | 2 refills | Status: AC | PRN
  Filled 2024-03-26: qty 12, 30d supply, fill #0

## 2024-03-26 NOTE — Telephone Encounter (Signed)
 Last seen on 11/19/23 Follow up scheduled 06/08/24

## 2024-03-27 ENCOUNTER — Other Ambulatory Visit (HOSPITAL_COMMUNITY): Payer: Self-pay

## 2024-03-30 DIAGNOSIS — F908 Attention-deficit hyperactivity disorder, other type: Secondary | ICD-10-CM | POA: Diagnosis not present

## 2024-04-04 DIAGNOSIS — F908 Attention-deficit hyperactivity disorder, other type: Secondary | ICD-10-CM | POA: Diagnosis not present

## 2024-04-06 DIAGNOSIS — F908 Attention-deficit hyperactivity disorder, other type: Secondary | ICD-10-CM | POA: Diagnosis not present

## 2024-04-13 DIAGNOSIS — F908 Attention-deficit hyperactivity disorder, other type: Secondary | ICD-10-CM | POA: Diagnosis not present

## 2024-04-27 ENCOUNTER — Other Ambulatory Visit (HOSPITAL_COMMUNITY): Payer: Self-pay

## 2024-04-27 ENCOUNTER — Other Ambulatory Visit: Payer: Self-pay

## 2024-04-28 ENCOUNTER — Other Ambulatory Visit (HOSPITAL_COMMUNITY): Payer: Self-pay

## 2024-04-28 MED ORDER — METFORMIN HCL 500 MG PO TABS
500.0000 mg | ORAL_TABLET | Freq: Two times a day (BID) | ORAL | 0 refills | Status: AC
  Filled 2024-04-28: qty 180, 90d supply, fill #0

## 2024-05-11 ENCOUNTER — Other Ambulatory Visit: Payer: Self-pay

## 2024-05-11 ENCOUNTER — Ambulatory Visit: Attending: Plastic Surgery

## 2024-05-11 DIAGNOSIS — M6281 Muscle weakness (generalized): Secondary | ICD-10-CM

## 2024-05-11 DIAGNOSIS — R3911 Hesitancy of micturition: Secondary | ICD-10-CM | POA: Diagnosis present

## 2024-05-11 DIAGNOSIS — M62838 Other muscle spasm: Secondary | ICD-10-CM

## 2024-05-11 DIAGNOSIS — R279 Unspecified lack of coordination: Secondary | ICD-10-CM

## 2024-05-11 DIAGNOSIS — R293 Abnormal posture: Secondary | ICD-10-CM

## 2024-05-11 NOTE — Progress Notes (Signed)
 " OUTPATIENT PHYSICAL THERAPY FEMALE PELVIC EVALUATION   Patient Name: Kelly Morton MRN: [id number] DOB:[birth year], 29 y.o., female Today's Date: 05/11/2024  END OF SESSION:  PT End of Session - 05/11/24 0851     Visit Number 1    Date for Recertification  10/26/24    Authorization Type [employer] employee    PT Start Time [phone]    PT Stop Time 0930    PT Time Calculation (min) 41 min    Activity Tolerance Patient tolerated treatment well    Behavior During Therapy [facility] for tasks assessed/performed          Past Medical History:  Diagnosis Date   H. pylori infection 08/28/2020   Injury of meniscus of left knee 12/16/2021   Ovarian cyst    PTSD (post-traumatic stress disorder)    Sore throat 05/18/2020   Syncope    Past Surgical History:  Procedure Laterality Date   IR LUMBAR PUNCTURE  02/15/2023   OOPHORECTOMY     TONSILLECTOMY     Patient Active Problem List   Diagnosis Date Noted   Body aches 04/04/2023   Gait abnormality 04/04/2023   Other fatigue 01/30/2023   Vitamin D  deficiency 01/21/2023   Chronic migraine w/o aura w/o status migrainosus, not intractable 01/17/2023   Relapsing remitting multiple sclerosis 01/17/2023   Vitamin B12 deficiency 01/17/2023   Psoriasis 03/09/2022   Anxiety 03/09/2022   MVP (mitral valve prolapse) 02/26/2022   Vasovagal syncope 07/01/2020   Mild mitral regurgitation 07/01/2020   Seizure-like activity (HCC) 12/17/2019    PCP: Diona Perkins, MD   REFERRING PROVIDER: Waddell Leonce NOVAK, MD   REFERRING DIAG: R39.11 (ICD-10-CM) - Urinary hesitancy  THERAPY DIAG:  Urinary hesitancy  Other muscle spasm  Muscle weakness (generalized)  Unspecified lack of coordination  Abnormal posture  Rationale for Evaluation and Treatment: Rehabilitation  ONSET DATE: November 2024   SUBJECTIVE:                                                                                                                                                                                            SUBJECTIVE STATEMENT: Pt states that she was diagnosed with MS 2024. Initially she was not having any bowel or bladder symptoms, but now she cannot empty her bladder easily. She had UTIs as a child, but none recently. She is having diarrhea associated with MS and has lost 80lbs in the last year.    PAIN:  Are you having pain? No   PRECAUTIONS: None  RED FLAGS: None   WEIGHT BEARING RESTRICTIONS: No  FALLS:  Has patient fallen in last 6 months?  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  ACTIVITY LEVEL : not currently  PLOF: Independent  PATIENT GOALS: to be bale to empty bladder normally   PERTINENT HISTORY:  Multiple sclerosis, rapid weight loss, internal hemorrhoids, Rt oophorectomy/salpingectomy  Sexual abuse: No  BOWEL MOVEMENT: Pain with bowel movement: No Type of bowel movement:Type (Bristol Stool Scale) 4-7, Frequency 2-3x/day, and Strain no Fully empty rectum: Yes: 65% of the time; occasional hesitancy with BM Leakage: Yes: when she was having more diarrhea  Urgency: Yes: sometimes  Pads: No Fiber supplement/laxative none  URINATION: Pain with urination: No, but she can feel pressure  Fully empty bladder: No Stream: hesitant - can take at least 6 minutes to start stream  Urgency: No Frequency: 1x/every 2 hours  Nocturia: 0x Fluid Intake: around 60 oz of water a day  Leakage: Coughing, Sneezing, and Laughing Pads: No  INTERCOURSE:  No issues  PREGNANCY: No children   PROLAPSE: Sometimes around ovulation    OBJECTIVE:  Note: Objective measures were completed at Evaluation unless otherwise noted.  05/11/2024 PATIENT SURVEYS:   PFIQ-7: UIQ-7: 67  COGNITION: Overall cognitive status: Within functional limits for tasks assessed     SENSATION: Light touch: Appears intact   FUNCTIONAL TESTS:  Squat: WNL Single leg stance:  Rt: stable  Lt: stable Curl-up test: WNL Sit-up test:  2/3 Active straight leg raise: pelvic rotation bil    GAIT: Assistive device utilized: None Comments: WNL  POSTURE: rounded shoulders, forward head, increased thoracic kyphosis, and Right elevated shoulder and Rt posterior pelvic rotation   LUMBARAROM/PROM: WNL    PALPATION: General: WNL  Abdominal: Sternocostal angle: WNL Breathing: WNL Tenderness: Lt lower quadrant Scar tissue: Rt lower quadrant restriction Diastasis: 1 finger width upper abdominals                 External Perineal Exam: WNL                             Internal Pelvic Floor: some dryness, slight increase tone and discomfort on Lt; bil urehtral restriction with stinging, trigone restriction and urgency   Patient confirms identification and approves PT to assess internal pelvic floor and treatment Yes  PELVIC MMT:   MMT eval  Vaginal 1-2/5, 3 second endurance 5 repeat contractions, poor coordination   (Blank rows = not tested)        TONE: Slight increase on Lt   PROLAPSE: WNL  TODAY'S TREATMENT:                                                                                                                              DATE:  05/11/2024 EVAL  Neuromuscular re-education: Diaphragmatic breathing with cues for pelvic floor muscle lengthening Initial down training program  Therapeutic activities: 5 minute limit on urination attempts No straining with urination Trying squatty potty to urinate     PATIENT EDUCATION:  Education details: See above Person educated: Patient Education method: Explanation, Demonstration, Tactile cues, Verbal cues, and Handouts Education comprehension: verbalized understanding  HOME EXERCISE PROGRAM: [id number]  ASSESSMENT:  CLINICAL IMPRESSION: Patient is a 29 y.o. female who was seen today for physical therapy evaluation and treatment for urinary hesitancy. Exam findings notable for mild core weakness, tenderness in Lt lower quadrant, abdominal scar tissue  restriction Rt lower quadrant, urethral abd trigone restriction, increased in Lt pelvic floor muscle tension, pelvic floor muscle weakness, decreased pelvic floor muscle coordination, and decreased pelvic floor muscle endurance. Signs and symptoms are most consistent with poor pelvic floor muscle coordination and restirction around urethra and bladder; due to MS, there is underlying possibility that this is impacting ability to empty bladder, but with objective findings that can help explain symptoms, we are hopeful that patient will be able to see progress. Initial treatment consisted of initial down training program, diaphragmatic breathing for pelvic floor muscle relaxation/lengthening, and voiding mechanics. She will continue to benefit from skilled PT intervention in order to decrease urinary hesitancy, address impairments, and improve quality of life.   OBJECTIVE IMPAIRMENTS: decreased activity tolerance, decreased coordination, decreased endurance, decreased mobility, decreased ROM, decreased strength, increased fascial restrictions, increased muscle spasms, impaired flexibility, impaired tone, improper body mechanics, postural dysfunction, and pain.   ACTIVITY LIMITATIONS: continence and urinary hesitancy  PARTICIPATION LIMITATIONS: community activity, occupation, and exercise  PERSONAL FACTORS: 3+ comorbidities: medical history are also affecting patient's functional outcome.   REHAB POTENTIAL: Good  CLINICAL DECISION MAKING: Evolving/moderate complexity  EVALUATION COMPLEXITY: Moderate   GOALS: Goals reviewed with patient? Yes  SHORT TERM GOALS: Target date: 06/08/2024    Pt will be independent with HEP in order to improve activity tolerance.   Baseline: Goal status: INITIAL  2.  Pt will report 25% improvement in urinary hesitancy in order to decrease risk of infection.  Baseline:  Goal status: INITIAL  3.  Pt will be independent with use of squatty potty, relaxed toileting  mechanics, and improved bowel movement techniques in order to increase ease of bowel movements and complete evacuation.    Baseline:  Goal status: INITIAL  4.  Pt will be independent with appropriate bladder emptying techniques, including limiting time on toilet, not straining, double voiding, and performing diaphragmatic breathing.  Baseline:  Goal status: INITIAL  5.  Pt will be independent with diaphragmatic breathing and down training activities in order to improve pelvic floor relaxation.  Baseline:  Goal status: INITIAL   LONG TERM GOALS: Target date: 10/26/2024  Pt will be independent with advanced HEP in order to improve activity tolerance.   Baseline:  Goal status: INITIAL  2.  Pt will improve UIQ-7 score to less than 20 in order to demonstrate less impact of urinary hesitancy on quality of life.  Baseline: 67 Goal status: INITIAL  3.  Pt will report 75% improvement in urinary hesitancy in order to decrease risk of infection. Baseline:  Goal status: INITIAL  4.  Pt will demonstrate normal urethral and trigone mobility without any increase in urgency and stinging in order to decrease hesitancy and improve complete emptying to avoid infection.  Baseline:  Goal status: INITIAL  5.  Pt will demonstrate normal pelvic floor muscle tone and A/ROM, able to achieve 3/5 strength with contractions and 10 sec endurance, in order to reduce urinary leaking and number of pads patient wears.   Baseline:  Goal status: INITIAL   PLAN:  PT FREQUENCY: 1-2x/week  PT DURATION: 12 visits  PLANNED INTERVENTIONS: 97164- PT Re-evaluation, 97110-Therapeutic exercises, 97530- Therapeutic activity, 97112- Neuromuscular re-education, [phone]- Self Care, 02859- Manual therapy, [phone]- Gait training, [phone]- Aquatic Therapy, [phone]- Electrical stimulation (unattended), [phone]- Traction (mechanical), [id number]- Ionotophoresis 4mg /ml Dexamethasone , 20560 (1-2 muscles), 20561 (3+ muscles)- Dry Needling,  Patient/Family education, Balance training, Taping, Joint mobilization, Joint manipulation, Spinal manipulation, Spinal mobilization, Scar mobilization, Vestibular training, Cryotherapy, Moist heat, and Biofeedback  PLAN FOR NEXT SESSION: urethral and trigone mobilizations, abdominal scar tissue mobilization; core strengthening and mobility  Josette Mares, PT, DPT01/19/2612:35 PM [facility] [address], [address] [place], [place] [address] Phone # [phone] Fax [phone]   "

## 2024-05-14 ENCOUNTER — Other Ambulatory Visit: Payer: Self-pay

## 2024-05-19 ENCOUNTER — Ambulatory Visit

## 2024-05-19 DIAGNOSIS — R279 Unspecified lack of coordination: Secondary | ICD-10-CM

## 2024-05-19 DIAGNOSIS — R3911 Hesitancy of micturition: Secondary | ICD-10-CM | POA: Diagnosis not present

## 2024-05-19 DIAGNOSIS — R293 Abnormal posture: Secondary | ICD-10-CM

## 2024-05-19 DIAGNOSIS — M62838 Other muscle spasm: Secondary | ICD-10-CM

## 2024-05-19 DIAGNOSIS — M6281 Muscle weakness (generalized): Secondary | ICD-10-CM

## 2024-05-19 NOTE — Therapy (Signed)
 " OUTPATIENT PHYSICAL THERAPY FEMALE PELVIC EVALUATION   Patient Name: Kelly Morton MRN: [id number] DOB:[birth year], 29 y.o., female Today's Date: 05/19/2024  END OF SESSION:  PT End of Session - 05/19/24 0852     Visit Number 2    Date for Recertification  10/26/24    Authorization Type [employer] employee    PT Start Time [phone]    PT Stop Time 0930    PT Time Calculation (min) 40 min    Activity Tolerance Patient tolerated treatment well    Behavior During Therapy [facility] for tasks assessed/performed          Past Medical History:  Diagnosis Date   H. pylori infection 08/28/2020   Injury of meniscus of left knee 12/16/2021   Ovarian cyst    PTSD (post-traumatic stress disorder)    Sore throat 05/18/2020   Syncope    Past Surgical History:  Procedure Laterality Date   IR LUMBAR PUNCTURE  02/15/2023   OOPHORECTOMY     TONSILLECTOMY     Patient Active Problem List   Diagnosis Date Noted   Body aches 04/04/2023   Gait abnormality 04/04/2023   Other fatigue 01/30/2023   Vitamin D  deficiency 01/21/2023   Chronic migraine w/o aura w/o status migrainosus, not intractable 01/17/2023   Relapsing remitting multiple sclerosis 01/17/2023   Vitamin B12 deficiency 01/17/2023   Psoriasis 03/09/2022   Anxiety 03/09/2022   MVP (mitral valve prolapse) 02/26/2022   Vasovagal syncope 07/01/2020   Mild mitral regurgitation 07/01/2020   Seizure-like activity (HCC) 12/17/2019    PCP: Diona Perkins, MD   REFERRING PROVIDER: Waddell Leonce NOVAK, MD   REFERRING DIAG: R39.11 (ICD-10-CM) - Urinary hesitancy 10/26/2024  THERAPY DIAG:  Urinary hesitancy  Other muscle spasm  Muscle weakness (generalized)  Unspecified lack of coordination  Abnormal posture  Rationale for Evaluation and Treatment: Rehabilitation  ONSET DATE: November 2024    SUBJECTIVE:                                                                                                                                                                                             SUBJECTIVE STATEMENT: Pt states that she has been doing much better. She only had one time in which she had any hesitancy and using a stool immediately helped her relax and urinate. She thought she only needed to perform down training exercises if she could not urinate.      PAIN:  Are you having pain? No     PRECAUTIONS: None   RED FLAGS: None       WEIGHT BEARING RESTRICTIONS: No   FALLS:  Has patient fallen in last 6 months? 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   ACTIVITY LEVEL : not currently   PLOF: Independent   PATIENT GOALS: to be bale to empty bladder normally    PERTINENT HISTORY:  Multiple sclerosis, rapid weight loss, internal hemorrhoids, Rt oophorectomy/salpingectomy  Sexual abuse: No   BOWEL MOVEMENT: Pain with bowel movement: No Type of bowel movement:Type (Bristol Stool Scale) 4-7, Frequency 2-3x/day, and Strain no Fully empty rectum: Yes: 65% of the time; occasional hesitancy with BM Leakage: Yes: when she was having more diarrhea  Urgency: Yes: sometimes  Pads: No Fiber supplement/laxative none   URINATION: Pain with urination: No, but she can feel pressure  Fully empty bladder: No Stream: hesitant - can take at least 6 minutes to start stream  Urgency: No Frequency: 1x/every 2 hours  Nocturia: 0x Fluid Intake: around 60 oz of water a day  Leakage: Coughing, Sneezing, and Laughing Pads: No   INTERCOURSE:             No issues   PREGNANCY: No children    PROLAPSE: Sometimes around ovulation      OBJECTIVE:  Note: Objective measures were completed at Evaluation unless otherwise noted.   05/11/2024 PATIENT SURVEYS:    PFIQ-7: UIQ-7: 67   COGNITION: Overall cognitive status: Within functional limits for tasks assessed                          SENSATION: Light touch: Appears intact     FUNCTIONAL TESTS:  Squat: WNL Single leg stance:             Rt:  stable             Lt: stable Curl-up test: WNL Sit-up test: 2/3 Active straight leg raise: pelvic rotation bil      GAIT: Assistive device utilized: None Comments: WNL   POSTURE: rounded shoulders, forward head, increased thoracic kyphosis, and Right elevated shoulder and Rt posterior pelvic rotation     LUMBARAROM/PROM: WNL       PALPATION: General: WNL   Abdominal: Sternocostal angle: WNL Breathing: WNL Tenderness: Lt lower quadrant Scar tissue: Rt lower quadrant restriction Diastasis: 1 finger width upper abdominals                  External Perineal Exam: WNL                             Internal Pelvic Floor: some dryness, slight increase tone and discomfort on Lt; bil urehtral restriction with stinging, trigone restriction and urgency    Patient confirms identification and approves PT to assess internal pelvic floor and treatment Yes   PELVIC MMT:   MMT eval  Vaginal 1-2/5, 3 second endurance 5 repeat contractions, poor coordination   (Blank rows = not tested)         TONE: Slight increase on Lt    PROLAPSE: WNL   TODAY'S TREATMENT:  DATE:  05/19/2024 Manual: Supine lower abdominal scar tissue/soft tissue mobilization  Supine lateral bladder mobilizations  Neuromuscular re-education: Transversus abdominus training with multimodal cues for improved motor control and breath coordination Transversus abdominus isometric + pelvic floor muscles 10x with tactile cues to improve symmetry and awareness of good relaxation  Supine hip adduction ball press with transversus abdominus and pelvic floor muscle contractions and breath coordination 10x Bridge with hip adduction, transversus abdominus, and pelvic floor muscle 10x Exercises: Lower trunk rotation 2 x 10 Open books 10x bil   05/11/2024 EVAL  Neuromuscular  re-education: Diaphragmatic breathing with cues for pelvic floor muscle lengthening Initial down training program  Therapeutic activities: 5 minute limit on urination attempts No straining with urination Trying squatty potty to urinate        PATIENT EDUCATION:  Education details: See above Person educated: Patient Education method: Explanation, Demonstration, Tactile cues, Verbal cues, and Handouts Education comprehension: verbalized understanding   HOME EXERCISE PROGRAM: [id number]   ASSESSMENT:   CLINICAL IMPRESSION: Patient is a 29 y.o. female who was seen today for physical therapy evaluation and treatment for urinary hesitancy. Pt doing very well after initial visit with significant improvements in urinary hesitancy. She did have one time and was able to use stool and immediately able to urinate. Today we focused on abdomina.bladder mobilization since she did not want to perform internal pelvic floor muscle work due to menstrual cycle. Good tolerance to manual techniques, but some soreness especially in Lt lower quadrant. She was able to begin core training with emphasis on good coordinated contraction, but also the importance of relaxation. She did very well with this and exercise progressions to improve anterior chain mobility. She will continue to benefit from skilled PT intervention in order to decrease urinary hesitancy, address impairments, and improve quality of life.    OBJECTIVE IMPAIRMENTS: decreased activity tolerance, decreased coordination, decreased endurance, decreased mobility, decreased ROM, decreased strength, increased fascial restrictions, increased muscle spasms, impaired flexibility, impaired tone, improper body mechanics, postural dysfunction, and pain.    ACTIVITY LIMITATIONS: continence and urinary hesitancy   PARTICIPATION LIMITATIONS: community activity, occupation, and exercise   PERSONAL FACTORS: 3+ comorbidities: medical history are also affecting  patient's functional outcome.    REHAB POTENTIAL: Good   CLINICAL DECISION MAKING: Evolving/moderate complexity   EVALUATION COMPLEXITY: Moderate     GOALS: Goals reviewed with patient? Yes   SHORT TERM GOALS: Target date: 06/08/2024       Pt will be independent with HEP in order to improve activity tolerance.    Baseline: Goal status: INITIAL   2.  Pt will report 25% improvement in urinary hesitancy in order to decrease risk of infection.   Baseline:  Goal status: INITIAL   3.  Pt will be independent with use of squatty potty, relaxed toileting mechanics, and improved bowel movement techniques in order to increase ease of bowel movements and complete evacuation.      Baseline:  Goal status: INITIAL   4.  Pt will be independent with appropriate bladder emptying techniques, including limiting time on toilet, not straining, double voiding, and performing diaphragmatic breathing.  Baseline:  Goal status: INITIAL   5.  Pt will be independent with diaphragmatic breathing and down training activities in order to improve pelvic floor relaxation.   Baseline:  Goal status: INITIAL     LONG TERM GOALS: Target date: 10/26/2024   Pt will be independent with advanced HEP in order to improve activity tolerance.    Baseline:  Goal status: INITIAL   2.  Pt will improve UIQ-7 score to less than 20 in order to demonstrate less impact of urinary hesitancy on quality of life.  Baseline: 67 Goal status: INITIAL   3.  Pt will report 75% improvement in urinary hesitancy in order to decrease risk of infection. Baseline:  Goal status: INITIAL   4.  Pt will demonstrate normal urethral and trigone mobility without any increase in urgency and stinging in order to decrease hesitancy and improve complete emptying to avoid infection.  Baseline:  Goal status: INITIAL   5.  Pt will demonstrate normal pelvic floor muscle tone and A/ROM, able to achieve 3/5 strength with contractions and 10  sec endurance, in order to reduce urinary leaking and number of pads patient wears.    Baseline:  Goal status: INITIAL     PLAN:   PT FREQUENCY: 1-2x/week   PT DURATION: 12 visits    PLANNED INTERVENTIONS: 97164- PT Re-evaluation, 97110-Therapeutic exercises, 97530- Therapeutic activity, 97112- Neuromuscular re-education, 97535- Self Care, 02859- Manual therapy, [phone]- Gait training, [phone]- Aquatic Therapy, [phone]- Electrical stimulation (unattended), [phone]- Traction (mechanical), [id number]- Ionotophoresis 4mg /ml Dexamethasone , 79439 (1-2 muscles), 20561 (3+ muscles)- Dry Needling, Patient/Family education, Balance training, Taping, Joint mobilization, Joint manipulation, Spinal manipulation, Spinal mobilization, Scar mobilization, Vestibular training, Cryotherapy, Moist heat, and Biofeedback   PLAN FOR NEXT SESSION: urethral and trigone mobilizations, abdominal scar tissue mobilization; core strengthening and mobility  Josette Mares, PT, DPT01/27/268:54 AM [facility] [address] [place], [place] [address] Phone # [phone] Fax [phone]  "

## 2024-05-27 ENCOUNTER — Other Ambulatory Visit: Payer: Self-pay

## 2024-05-28 ENCOUNTER — Other Ambulatory Visit (HOSPITAL_COMMUNITY): Payer: Self-pay

## 2024-05-28 MED ORDER — EPINEPHRINE 0.3 MG/0.3ML IJ SOAJ
INTRAMUSCULAR | 1 refills | Status: AC
  Filled 2024-05-28: qty 2, 7d supply, fill #0

## 2024-06-02 ENCOUNTER — Ambulatory Visit: Admitting: Physical Therapy

## 2024-06-08 ENCOUNTER — Ambulatory Visit: Admitting: Adult Health

## 2024-06-08 ENCOUNTER — Ambulatory Visit: Admitting: Physical Therapy

## 2024-06-09 ENCOUNTER — Ambulatory Visit

## 2024-06-16 ENCOUNTER — Ambulatory Visit: Admitting: Physical Therapy

## 2024-06-17 ENCOUNTER — Ambulatory Visit

## 2024-06-23 ENCOUNTER — Ambulatory Visit: Admitting: Physical Therapy

## 2024-06-25 ENCOUNTER — Ambulatory Visit

## 2024-06-30 ENCOUNTER — Ambulatory Visit: Admitting: Physical Therapy

## 2024-07-01 ENCOUNTER — Ambulatory Visit

## 2024-07-07 ENCOUNTER — Ambulatory Visit: Admitting: Physical Therapy

## 2024-07-08 ENCOUNTER — Ambulatory Visit

## 2024-07-15 ENCOUNTER — Ambulatory Visit
